# Patient Record
Sex: Female | Born: 1990
Health system: Southern US, Community
[De-identification: ages and names within clinical notes are randomized; demographics above are authoritative.]

## PROBLEM LIST (undated history)

## (undated) HISTORY — PX: SKIN BIOPSY: SHX1

---
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
## 2008-06-13 ENCOUNTER — Emergency Department: Payer: Self-pay | Admitting: Emergency Medicine

## 2008-10-05 ENCOUNTER — Emergency Department: Payer: Self-pay | Admitting: Emergency Medicine

## 2009-05-03 ENCOUNTER — Inpatient Hospital Stay: Payer: Self-pay | Admitting: Obstetrics and Gynecology

## 2015-05-20 ENCOUNTER — Encounter: Payer: Self-pay | Admitting: *Deleted

## 2015-05-20 ENCOUNTER — Emergency Department
Admission: EM | Admit: 2015-05-20 | Discharge: 2015-05-20 | Disposition: A | Discharge status: Home / Self Care | Payer: Self-pay | Attending: Emergency Medicine | Admitting: Emergency Medicine

## 2015-05-20 DIAGNOSIS — Y92410 Unspecified street and highway as the place of occurrence of the external cause: Secondary | ICD-10-CM | POA: Insufficient documentation

## 2015-05-20 DIAGNOSIS — Y9389 Activity, other specified: Secondary | ICD-10-CM | POA: Insufficient documentation

## 2015-05-20 DIAGNOSIS — Y998 Other external cause status: Secondary | ICD-10-CM | POA: Insufficient documentation

## 2015-05-20 DIAGNOSIS — S0101XA Laceration without foreign body of scalp, initial encounter: Secondary | ICD-10-CM | POA: Insufficient documentation

## 2015-05-20 MED ORDER — METHOCARBAMOL 500 MG PO TABS
1000.0000 mg | ORAL_TABLET | Freq: Once | ORAL | Status: AC
  Administered 2015-05-20: 1000 mg via ORAL
  Filled 2015-05-20: qty 2

## 2015-05-20 MED ORDER — LIDOCAINE HCL (PF) 1 % IJ SOLN
INTRAMUSCULAR | Status: AC
  Filled 2015-05-20: qty 5

## 2015-05-20 MED ORDER — METHOCARBAMOL 750 MG PO TABS: 1500.0000 mg | ORAL_TABLET | Freq: Four times a day (QID) | ORAL | Status: DC

## 2015-05-20 MED ORDER — TRAMADOL HCL 50 MG PO TABS
50.0000 mg | ORAL_TABLET | Freq: Once | ORAL | Status: AC
  Administered 2015-05-20: 50 mg via ORAL
  Filled 2015-05-20: qty 1

## 2015-05-20 MED ORDER — IBUPROFEN 800 MG PO TABS: 800.0000 mg | ORAL_TABLET | Freq: Three times a day (TID) | ORAL | Status: DC | PRN

## 2015-05-20 MED ORDER — IBUPROFEN 800 MG PO TABS
800.0000 mg | ORAL_TABLET | Freq: Once | ORAL | Status: AC
  Administered 2015-05-20: 800 mg via ORAL
  Filled 2015-05-20: qty 1

## 2015-05-20 MED ORDER — TRAMADOL HCL 50 MG PO TABS: 50.0000 mg | ORAL_TABLET | Freq: Four times a day (QID) | ORAL | Status: DC | PRN
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
## 2015-06-03 ENCOUNTER — Emergency Department
Admission: EM | Admit: 2015-06-03 | Discharge: 2015-06-03 | Disposition: A | Discharge status: Home / Self Care | Payer: Self-pay | Attending: Emergency Medicine | Admitting: Emergency Medicine

## 2015-06-03 DIAGNOSIS — Z4802 Encounter for removal of sutures: Secondary | ICD-10-CM | POA: Insufficient documentation

## 2015-06-03 DIAGNOSIS — Z79899 Other long term (current) drug therapy: Secondary | ICD-10-CM | POA: Insufficient documentation

## 2015-06-03 NOTE — ED Notes (Signed)
Pt here for suture removal to back of head.

## 2015-06-03 NOTE — Discharge Instructions (Signed)
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
## 2016-02-17 ENCOUNTER — Encounter: Payer: Self-pay | Admitting: Emergency Medicine

## 2016-02-17 DIAGNOSIS — Z5321 Procedure and treatment not carried out due to patient leaving prior to being seen by health care provider: Secondary | ICD-10-CM | POA: Diagnosis not present

## 2016-02-17 DIAGNOSIS — R109 Unspecified abdominal pain: Secondary | ICD-10-CM | POA: Insufficient documentation

## 2016-02-17 LAB — COMPREHENSIVE METABOLIC PANEL
ALBUMIN: 4.6 g/dL (ref 3.5–5.0)
ALT: 19 U/L (ref 14–54)
ANION GAP: 8 (ref 5–15)
AST: 19 U/L (ref 15–41)
Alkaline Phosphatase: 71 U/L (ref 38–126)
BUN: 13 mg/dL (ref 6–20)
CHLORIDE: 107 mmol/L (ref 101–111)
CO2: 23 mmol/L (ref 22–32)
Calcium: 9.3 mg/dL (ref 8.9–10.3)
Creatinine, Ser: 0.95 mg/dL (ref 0.44–1.00)
GFR calc Af Amer: >60 mL/min (ref >60)
GFR calc non Af Amer: >60 mL/min (ref >60)
GLUCOSE: 111 mg/dL — AB (ref 65–99)
POTASSIUM: 4.3 mmol/L (ref 3.5–5.1)
Sodium: 138 mmol/L (ref 135–145)
Total Bilirubin: 0.6 mg/dL (ref 0.3–1.2)
Total Protein: 7.7 g/dL (ref 6.5–8.1)

## 2016-02-17 LAB — CBC
HEMATOCRIT: 44.7 % (ref 35.0–47.0)
HEMOGLOBIN: 15.2 g/dL (ref 12.0–16.0)
MCH: 31 pg (ref 26.0–34.0)
MCHC: 34 g/dL (ref 32.0–36.0)
MCV: 91.3 fL (ref 80.0–100.0)
Platelets: 286 10*3/uL (ref 150–440)
RBC: 4.89 MIL/uL (ref 3.80–5.20)
RDW: 12.3 % (ref 11.5–14.5)
WBC: 10 10*3/uL (ref 3.6–11.0)

## 2016-02-17 LAB — URINALYSIS COMPLETE WITH MICROSCOPIC (ARMC ONLY)
BACTERIA UA: NONE SEEN
Bilirubin Urine: NEGATIVE
Glucose, UA: NEGATIVE mg/dL
HGB URINE DIPSTICK: NEGATIVE
LEUKOCYTES UA: NEGATIVE
NITRITE: NEGATIVE
PH: 5 (ref 5.0–8.0)
PROTEIN: 30 mg/dL — AB
SPECIFIC GRAVITY, URINE: 1.041 — AB (ref 1.005–1.030)

## 2016-02-17 LAB — POCT PREGNANCY, URINE: PREG TEST UR: NEGATIVE

## 2016-02-17 LAB — LIPASE, BLOOD: LIPASE: 31 U/L (ref 11–51)

## 2016-02-17 NOTE — ED Triage Notes (Signed)
Patient ambulatory to triage with steady gait, without difficulty or distress noted; pt reports having right lower abd pain since Saturday accomp by recent constipation; BM today but pain increased this evening ; denies N/V

## 2016-02-18 ENCOUNTER — Emergency Department
Admission: EM | Admit: 2016-02-18 | Discharge: 2016-02-18 | Disposition: A | Discharge status: Home / Self Care | Payer: No Typology Code available for payment source | Attending: Student in an Organized Health Care Education/Training Program | Admitting: Student in an Organized Health Care Education/Training Program

## 2017-09-27 ENCOUNTER — Encounter: Payer: Self-pay | Admitting: Emergency Medicine

## 2017-09-27 ENCOUNTER — Emergency Department
Admission: EM | Admit: 2017-09-27 | Discharge: 2017-09-27 | Discharge status: Against Medical Advice | Payer: Self-pay | Attending: Emergency Medicine | Admitting: Emergency Medicine

## 2017-09-27 DIAGNOSIS — Z5321 Procedure and treatment not carried out due to patient leaving prior to being seen by health care provider: Secondary | ICD-10-CM | POA: Insufficient documentation

## 2017-09-27 DIAGNOSIS — T7840XA Allergy, unspecified, initial encounter: Secondary | ICD-10-CM | POA: Insufficient documentation

## 2017-09-27 NOTE — ED Triage Notes (Signed)
Pt comes into the  ED via POV c/o possible allergic reaction.  Patient states she had feta cheese on Monday and today and had the same symptom both times.  States she feels like her throat is slightly inflamed but there is no pain, no difficulty breathing, and no difficulty swallowing.  Patient in NAD at this time with even and unlabored respirations. Patient took zyrtec prior to arriving and she states it has gotten better since then.

## 2017-09-27 NOTE — ED Notes (Signed)
Called in Pod D wait with no answer. 

## 2017-09-27 NOTE — ED Notes (Signed)
Patient called to be roomed x3, no response noted at this time from patient.  Staff checked restrooms, lobby, and outdoor facility.   

## 2017-09-27 NOTE — ED Notes (Signed)
Called in [place] wait with no answer.

## 2018-11-26 ENCOUNTER — Emergency Department: Payer: Commercial Managed Care - PPO

## 2018-11-26 ENCOUNTER — Encounter: Payer: Self-pay | Admitting: Emergency Medicine

## 2018-11-26 ENCOUNTER — Emergency Department
Admission: EM | Admit: 2018-11-26 | Discharge: 2018-11-26 | Disposition: A | Discharge status: Home / Self Care | Payer: Commercial Managed Care - PPO | Attending: Emergency Medicine | Admitting: Emergency Medicine

## 2018-11-26 ENCOUNTER — Other Ambulatory Visit: Payer: Self-pay

## 2018-11-26 DIAGNOSIS — R079 Chest pain, unspecified: Secondary | ICD-10-CM | POA: Insufficient documentation

## 2018-11-26 LAB — COMPREHENSIVE METABOLIC PANEL
ALT: 12 U/L (ref 0–44)
AST: 24 U/L (ref 15–41)
Albumin: 4.1 g/dL (ref 3.5–5.0)
Alkaline Phosphatase: 53 U/L (ref 38–126)
Anion gap: 5 (ref 5–15)
BUN: 13 mg/dL (ref 6–20)
CO2: 24 mmol/L (ref 22–32)
Calcium: 8.5 mg/dL — ABNORMAL LOW (ref 8.9–10.3)
Chloride: 110 mmol/L (ref 98–111)
Creatinine, Ser: 0.64 mg/dL (ref 0.44–1.00)
GFR calc Af Amer: >60 mL/min (ref >60)
GFR calc non Af Amer: >60 mL/min (ref >60)
Glucose, Bld: 94 mg/dL (ref 70–99)
Potassium: 4.4 mmol/L (ref 3.5–5.1)
Sodium: 139 mmol/L (ref 135–145)
Total Bilirubin: 0.9 mg/dL (ref 0.3–1.2)
Total Protein: 6.8 g/dL (ref 6.5–8.1)

## 2018-11-26 LAB — CBC WITH DIFFERENTIAL/PLATELET
Abs Immature Granulocytes: 0 10*3/uL (ref 0.00–0.07)
Basophils Absolute: 0.1 10*3/uL (ref 0.0–0.1)
Basophils Relative: 1 %
Eosinophils Absolute: 0.3 10*3/uL (ref 0.0–0.5)
Eosinophils Relative: 5 %
HCT: 38 % (ref 36.0–46.0)
Hemoglobin: 12.9 g/dL (ref 12.0–15.0)
Immature Granulocytes: 0 %
Lymphocytes Relative: 32 %
Lymphs Abs: 1.8 10*3/uL (ref 0.7–4.0)
MCH: 30.9 pg (ref 26.0–34.0)
MCHC: 33.9 g/dL (ref 30.0–36.0)
MCV: 90.9 fL (ref 80.0–100.0)
Monocytes Absolute: 0.3 10*3/uL (ref 0.1–1.0)
Monocytes Relative: 6 %
Neutro Abs: 3.2 10*3/uL (ref 1.7–7.7)
Neutrophils Relative %: 56 %
Platelets: 296 10*3/uL (ref 150–400)
RBC: 4.18 MIL/uL (ref 3.87–5.11)
RDW: 12 % (ref 11.5–15.5)
WBC: 5.7 10*3/uL (ref 4.0–10.5)
nRBC: 0 % (ref 0.0–0.2)

## 2018-11-26 LAB — POCT PREGNANCY, URINE: Preg Test, Ur: NEGATIVE

## 2018-11-26 LAB — TROPONIN I (HIGH SENSITIVITY): Troponin I (High Sensitivity): <2 ng/L (ref <18)

## 2018-11-26 MED ORDER — LIDOCAINE VISCOUS HCL 2 % MT SOLN
15.0000 mL | Freq: Once | OROMUCOSAL | Status: AC
  Administered 2018-11-26: 13:00:00 15 mL via ORAL
  Filled 2018-11-26: qty 15

## 2018-11-26 MED ORDER — ALUM & MAG HYDROXIDE-SIMETH 200-200-20 MG/5ML PO SUSP
30.0000 mL | Freq: Once | ORAL | Status: AC
  Administered 2018-11-26: 30 mL via ORAL
  Filled 2018-11-26: qty 30

## 2018-11-26 MED ORDER — DICYCLOMINE HCL 10 MG/5ML PO SOLN
10.0000 mg | Freq: Once | ORAL | Status: AC
  Administered 2018-11-26: 10 mg via ORAL
  Filled 2018-11-26 (×2): qty 5

## 2018-11-26 MED ORDER — FAMOTIDINE 20 MG PO TABS: 20.0000 mg | ORAL_TABLET | Freq: Two times a day (BID) | ORAL | 1 refills | Status: DC

## 2018-11-26 NOTE — ED Notes (Signed)
Patient is resting comfortably. 

## 2018-11-26 NOTE — ED Notes (Signed)
ED Provider Malinda  at bedside. 

## 2018-11-26 NOTE — ED Provider Notes (Signed)
[facility] Emergency Department Provider Note   ____________________________________________   First MD Initiated Contact with Patient 11/26/18 1208     (approximate)  I have reviewed the triage vital signs and the nursing notes.   HISTORY  Chief Complaint Chest Pain    HPI Shelby Hartman is a [age] y.o. female reports chest pressure starting last night while she was lying down watching TV.  She told the nurses died about 1030.  Is been going on constantly since that as long as she is been awake.  She did go to sleep at about 2:00 last night.  Pain is heavy it is not made worse by deep breathing.  She does feel little short of breath though.  She is a smoker but does not take any medications.  Exercise does not change it.  Drinking does not change it.  She has not eaten since the pain started.  Pain is moderate.         History reviewed. No pertinent past medical history.  There are no active problems to display for this patient.   Past Surgical History:  Procedure Laterality Date  . CESAREAN SECTION      Prior to Admission medications   Medication Sig Start Date End Date Taking? Authorizing Provider  ibuprofen (ADVIL,MOTRIN) 800 MG tablet Take 1 tablet (800 mg total) by mouth every 8 (eight) hours as needed. 05/20/15   Joni ReiningSmith, Ronald K, PA-C  methocarbamol (ROBAXIN-750) 750 MG tablet Take 2 tablets (1,500 mg total) by mouth 4 (four) times daily. 05/20/15   Joni ReiningSmith, Ronald K, PA-C  traMADol (ULTRAM) 50 MG tablet Take 1 tablet (50 mg total) by mouth every 6 (six) hours as needed for moderate pain. 05/20/15   Joni ReiningSmith, Ronald K, PA-C    Allergies Patient has no known allergies.  History reviewed. No pertinent family history.  Social History Social History   Tobacco Use  . Smoking status: Never Smoker  . Smokeless tobacco: Never Used  Substance Use Topics  . Alcohol use: No  . Drug use: Never    Review of Systems  Constitutional: No  fever/chills Eyes: No visual changes. ENT: No sore throat. Cardiovascular: chest pain. Respiratory:  shortness of breath. Gastrointestinal: No abdominal pain.  No nausea, no vomiting.  No diarrhea.  No constipation. Genitourinary: Negative for dysuria. Musculoskeletal: Negative for back pain. Skin: Negative for rash. Neurological: Negative for headaches, focal weakness  ____________________________________________   PHYSICAL EXAM:  VITAL SIGNS: ED Triage Vitals  Enc Vitals Group     BP --      Pulse Rate 11/26/18 1205 66     Resp 11/26/18 1205 15     Temp --      Temp src --      SpO2 --      Weight 11/26/18 1206 118 lb (53.5 kg)     Height 11/26/18 1206 5\' 1"  (1.549 m)     Head Circumference --      Peak Flow --      Pain Score 11/26/18 1206 8     Pain Loc --      Pain Edu? --      Excl. in [place]? --     Constitutional: Alert and oriented.  Patient is tearful but does not appear to be in any marked distress Eyes: Conjunctivae are normal. I. Head: Atraumatic. Nose: No congestion/rhinnorhea. Mouth/Throat: Mucous membranes are moist.  Oropharynx non-erythematous. Neck: No stridor.  Cardiovascular: Normal rate, regular rhythm. Grossly normal  heart sounds.  Good peripheral circulation. Respiratory: Normal respiratory effort.  No retractions. Lungs CTAB. Gastrointestinal: Soft and nontender. No distention. No abdominal bruits. . Musculoskeletal: No lower extremity tenderness nor edema.   Neurologic:  Normal speech and language. No gross focal neurologic deficits are appreciated. Skin:  Skin is warm, dry and intact. No rash noted. Psychiatric: Mood and affect are normal. Speech and behavior are normal.  ____________________________________________   LABS (all labs ordered are listed, but only abnormal results are displayed)  Labs Reviewed  COMPREHENSIVE METABOLIC PANEL - Abnormal; Notable for the following components:      Result Value   Calcium 8.5 (*)    All other  components within normal limits  TROPONIN I (HIGH SENSITIVITY)  CBC WITH DIFFERENTIAL/PLATELET  TROPONIN I (HIGH SENSITIVITY)  POC URINE PREG, ED  POCT PREGNANCY, URINE   ____________________________________________  EKG  EKG read and interpreted by me shows normal sinus rhythm rate of 66 normal axis no acute ST-T wave changes ____________________________________________  RADIOLOGY  ED MD interpretation:  Official radiology report(s): Dg Chest 2 View  Result Date: 11/26/2018 CLINICAL DATA:  28 year old female with a history of chest pain EXAM: CHEST - 2 VIEW COMPARISON:  12/03/2007 FINDINGS: Cardiomediastinal silhouette within normal limits in size and contour. No evidence of central vascular congestion or interlobular septal thickening. No pneumothorax or pleural effusion. No confluent airspace disease. Mild scoliotic curvature of the thoracic spine is similar to the comparison plain film. No displaced fracture IMPRESSION: Negative for acute cardiopulmonary disease Electronically Signed   By: Corrie Mckusick D.O.   On: 11/26/2018 12:46    ____________________________________________   PROCEDURES  Procedure(s) performed (including Critical Care):  Procedures   ____________________________________________   INITIAL IMPRESSION / ASSESSMENT AND PLAN / ED COURSE    Patient's pain resolved completely with a GI cocktail.  Troponin is negative EKG looks okay.  Most likely this pain was from esophageal spasm.  I will have her follow-up with her regular doctor return if she is worse.         ____________________________________________   FINAL CLINICAL IMPRESSION(S) / ED DIAGNOSES  Final diagnoses:  Chest pain, unspecified type     ED Discharge Orders    None       Note:  This document was prepared using Dragon voice recognition software and may include unintentional dictation errors.    Nena Polio, MD 11/26/18 [phone]

## 2018-11-26 NOTE — ED Notes (Signed)
ED Provider, Cinda Quest  at bedside to provide report.

## 2018-11-26 NOTE — Discharge Instructions (Addendum)
If the pain returns and is not relieved by Maalox or Tums please return here.  Please take the Pepcid 1 twice a day for the next week or so.  Please follow-up with your regular doctor.  I think that this chest discomfort was caused by a little reflux and/or esophageal spasm.  Your heart blood test and EKG look normal.

## 2018-11-26 NOTE — ED Notes (Signed)
Patient transported to Ultrasound 

## 2018-11-26 NOTE — ED Triage Notes (Signed)
Pt from home via [facility]. Per EMS, pt present w/mid CP since yesterday at 1030pm, mid CP feel "like pressure" and "my arms start tingling". Given by EMS PTA 324 ASA, 2 sub nitro. NAD noted upon arrival.

## 2018-11-26 NOTE — ED Notes (Signed)
Pt st intermittent CP 8/10 was sudden  when pt was lying down in bed, pt denies dizziness, N/V. Per pt "it get worse when I sit down" "it feels like something it's stuck in my troath". Pt denies fever, cough. NAD noted upon assessment.

## 2018-12-06 ENCOUNTER — Other Ambulatory Visit (HOSPITAL_COMMUNITY): Payer: Self-pay | Admitting: Gastroenterology

## 2018-12-06 ENCOUNTER — Other Ambulatory Visit: Payer: Self-pay | Admitting: Gastroenterology

## 2018-12-06 DIAGNOSIS — R1310 Dysphagia, unspecified: Secondary | ICD-10-CM

## 2018-12-06 DIAGNOSIS — R1319 Other dysphagia: Secondary | ICD-10-CM

## 2018-12-11 ENCOUNTER — Other Ambulatory Visit: Payer: Self-pay

## 2018-12-11 ENCOUNTER — Ambulatory Visit
Admission: RE | Admit: 2018-12-11 | Discharge: 2018-12-11 | Disposition: A | Discharge status: Home / Self Care | Payer: Commercial Managed Care - PPO | Source: Ambulatory Visit | Attending: Gastroenterology | Admitting: Gastroenterology

## 2018-12-11 DIAGNOSIS — R1310 Dysphagia, unspecified: Secondary | ICD-10-CM | POA: Insufficient documentation

## 2018-12-11 DIAGNOSIS — R1319 Other dysphagia: Secondary | ICD-10-CM

## 2018-12-28 ENCOUNTER — Other Ambulatory Visit: Admission: RE | Admit: 2018-12-28 | Payer: Commercial Managed Care - PPO | Source: Ambulatory Visit

## 2019-01-01 ENCOUNTER — Encounter: Admission: RE | Payer: Self-pay | Source: Home / Self Care

## 2019-01-01 ENCOUNTER — Ambulatory Visit
Admission: RE | Admit: 2019-01-01 | Payer: Commercial Managed Care - PPO | Source: Home / Self Care | Admitting: Internal Medicine

## 2019-01-01 SURGERY — ESOPHAGOGASTRODUODENOSCOPY (EGD) WITH PROPOFOL
Anesthesia: General

## 2019-01-18 ENCOUNTER — Encounter: Payer: Self-pay | Admitting: Certified Nurse Midwife

## 2019-01-18 ENCOUNTER — Telehealth: Payer: Self-pay

## 2019-01-18 ENCOUNTER — Other Ambulatory Visit: Payer: Self-pay

## 2019-01-18 ENCOUNTER — Telehealth: Payer: Self-pay | Admitting: Certified Nurse Midwife

## 2019-01-18 ENCOUNTER — Other Ambulatory Visit (HOSPITAL_COMMUNITY)
Admission: RE | Admit: 2019-01-18 | Discharge: 2019-01-18 | Disposition: A | Discharge status: Home / Self Care | Payer: Commercial Managed Care - PPO | Source: Ambulatory Visit | Attending: Certified Nurse Midwife | Admitting: Certified Nurse Midwife

## 2019-01-18 ENCOUNTER — Ambulatory Visit (INDEPENDENT_AMBULATORY_CARE_PROVIDER_SITE_OTHER): Payer: Commercial Managed Care - PPO | Admitting: Certified Nurse Midwife

## 2019-01-18 VITALS — BP 105/70 | HR 93 | Ht 61.0 in | Wt 113.4 lb

## 2019-01-18 DIAGNOSIS — Z124 Encounter for screening for malignant neoplasm of cervix: Secondary | ICD-10-CM | POA: Diagnosis not present

## 2019-01-18 DIAGNOSIS — Z30011 Encounter for initial prescription of contraceptive pills: Secondary | ICD-10-CM | POA: Diagnosis not present

## 2019-01-18 DIAGNOSIS — Z01419 Encounter for gynecological examination (general) (routine) without abnormal findings: Secondary | ICD-10-CM | POA: Diagnosis present

## 2019-01-18 DIAGNOSIS — Z13220 Encounter for screening for lipoid disorders: Secondary | ICD-10-CM | POA: Diagnosis not present

## 2019-01-18 DIAGNOSIS — Z136 Encounter for screening for cardiovascular disorders: Secondary | ICD-10-CM

## 2019-01-18 LAB — POCT URINE PREGNANCY: Preg Test, Ur: NEGATIVE

## 2019-01-18 MED ORDER — DROSPIRENONE-ETHINYL ESTRADIOL 3-0.02 MG PO TABS: 1.0000 | ORAL_TABLET | Freq: Every day | ORAL | 11 refills | Status: DC

## 2019-01-18 NOTE — Patient Instructions (Signed)
Preventive Care 21-30 Years Old, Female Preventive care refers to visits with your health care provider and lifestyle choices that can promote health and wellness. This includes:  A yearly physical exam. This may also be called an annual well check.  Regular dental visits and eye exams.  Immunizations.  Screening for certain conditions.  Healthy lifestyle choices, such as eating a healthy diet, getting regular exercise, not using drugs or products that contain nicotine and tobacco, and limiting alcohol use. What can I expect for my preventive care visit? Physical exam Your health care provider will check your:  Height and weight. This may be used to calculate body mass index (BMI), which tells if you are at a healthy weight.  Heart rate and blood pressure.  Skin for abnormal spots. Counseling Your health care provider may ask you questions about your:  Alcohol, tobacco, and drug use.  Emotional well-being.  Home and relationship well-being.  Sexual activity.  Eating habits.  Work and work environment.  Method of birth control.  Menstrual cycle.  Pregnancy history. What immunizations do I need?  Influenza (flu) vaccine  This is recommended every year. Tetanus, diphtheria, and pertussis (Tdap) vaccine  You may need a Td booster every 10 years. Varicella (chickenpox) vaccine  You may need this if you have not been vaccinated. Human papillomavirus (HPV) vaccine  If recommended by your health care provider, you may need three doses over 6 months. Measles, mumps, and rubella (MMR) vaccine  You may need at least one dose of MMR. You may also need a second dose. Meningococcal conjugate (MenACWY) vaccine  One dose is recommended if you are age 19-21 years and a first-year college student living in a residence hall, or if you have one of several medical conditions. You may also need additional booster doses. Pneumococcal conjugate (PCV13) vaccine  You may need  this if you have certain conditions and were not previously vaccinated. Pneumococcal polysaccharide (PPSV23) vaccine  You may need one or two doses if you smoke cigarettes or if you have certain conditions. Hepatitis A vaccine  You may need this if you have certain conditions or if you travel or work in places where you may be exposed to hepatitis A. Hepatitis B vaccine  You may need this if you have certain conditions or if you travel or work in places where you may be exposed to hepatitis B. Haemophilus influenzae type b (Hib) vaccine  You may need this if you have certain conditions. You may receive vaccines as individual doses or as more than one vaccine together in one shot (combination vaccines). Talk with your health care provider about the risks and benefits of combination vaccines. What tests do I need?  Blood tests  Lipid and cholesterol levels. These may be checked every 5 years starting at age 20.  Hepatitis C test.  Hepatitis B test. Screening  Diabetes screening. This is done by checking your blood sugar (glucose) after you have not eaten for a while (fasting).  Sexually transmitted disease (STD) testing.  BRCA-related cancer screening. This may be done if you have a family history of breast, ovarian, tubal, or peritoneal cancers.  Pelvic exam and Pap test. This may be done every 3 years starting at age 21. Starting at age 30, this may be done every 5 years if you have a Pap test in combination with an HPV test. Talk with your health care provider about your test results, treatment options, and if necessary, the need for more tests.   Follow these instructions at home: Eating and drinking   Eat a diet that includes fresh fruits and vegetables, whole grains, lean protein, and low-fat dairy.  Take vitamin and mineral supplements as recommended by your health care provider.  Do not drink alcohol if: ? Your health care provider tells you not to drink. ? You are  pregnant, may be pregnant, or are planning to become pregnant.  If you drink alcohol: ? Limit how much you have to 0-1 drink a day. ? Be aware of how much alcohol is in your drink. In the [place] one drink equals one 12 oz bottle of beer (355 mL), one 5 oz glass of wine (148 mL), or one 1 oz glass of hard liquor (44 mL). Lifestyle  Take daily care of your teeth and gums.  Stay active. Exercise for at least 30 minutes on 5 or more days each week.  Do not use any products that contain nicotine or tobacco, such as cigarettes, e-cigarettes, and chewing tobacco. If you need help quitting, ask your health care provider.  If you are sexually active, practice safe sex. Use a condom or other form of birth control (contraception) in order to prevent pregnancy and STIs (sexually transmitted infections). If you plan to become pregnant, see your health care provider for a preconception visit. What's next?  Visit your health care provider once a year for a well check visit.  Ask your health care provider how often you should have your eyes and teeth checked.  Stay up to date on all vaccines. This information is not intended to replace advice given to you by your health care provider. Make sure you discuss any questions you have with your health care provider. Document Released: 07/05/2001 Document Revised: 01/18/2018 Document Reviewed: 01/18/2018 Elsevier Patient Education  2020 Elsevier Inc.  

## 2019-01-18 NOTE — Progress Notes (Addendum)
GYNECOLOGY ANNUAL PREVENTATIVE CARE ENCOUNTER NOTE  History:     Shelby Hartman is a 28 y.o. No obstetric history on file. female here for a routine annual gynecologic exam.  Current complaints: none.   Denies abnormal vaginal bleeding, discharge, pelvic pain, problems with intercourse or other gynecologic concerns.     Is not married, has one female partner 2 children 24, 80 yr old Works from home.   Gynecologic History Patient's last menstrual period was 12/24/2018 (exact date). Contraception: condoms, had marina 2015-did not like it.  Last Pap: 42yr ago. Results were: normal per pt  Last mammogram: n/a .  Obstetric History OB History  Gravida Para Term Preterm AB Living  '2 2 2     2  ' SAB TAB Ectopic Multiple Live Births          2    # Outcome Date GA Lbr Len/2nd Weight Sex Delivery Anes PTL Lv  2 Term 05/03/09   6 lb 7 oz (2.92 kg) M CS-Unspec  N LIV  1 Term 08/23/05   5 lb 13 oz (2.637 kg) F CS-Unspec  N LIV    No past medical history on file.  Has family history of breast caner, information given on BRCA testing given.   Past Surgical History:  Procedure Laterality Date  . CESAREAN SECTION      Current Outpatient Medications on File Prior to Visit  Medication Sig Dispense Refill  . famotidine (PEPCID) 20 MG tablet Take 1 tablet (20 mg total) by mouth 2 (two) times daily. 60 tablet 1  . ibuprofen (ADVIL,MOTRIN) 800 MG tablet Take 1 tablet (800 mg total) by mouth every 8 (eight) hours as needed. 30 tablet 0   No current facility-administered medications on file prior to visit.     No Known Allergies  Social History:  reports that she has never smoked. She has never used smokeless tobacco. She reports that she does not drink alcohol or use drugs.  Denies smoking/vaping, drugs, occasional alcohol.  Exercise- used to , has not been able to since COVID and virtual schooling.   Family History  Problem Relation Age of Onset  . Breast cancer Maternal Aunt   .  Breast cancer Paternal Aunt     The following portions of the patient's history were reviewed and updated as appropriate: allergies, current medications, past family history, past medical history, past social history, past surgical history and problem list.  Review of Systems Pertinent items noted in HPI and remainder of comprehensive ROS otherwise negative.  Physical Exam:  BP 105/70   Pulse 93   Ht '5\' 1"'  (1.549 m)   Wt 113 lb 6 oz (51.4 kg)   LMP 12/24/2018 (Exact Date)   BMI 21.42 kg/m  CONSTITUTIONAL: Well-developed, well-nourished female in no acute distress.  HENT:  Normocephalic, atraumatic, External right and left ear normal. Oropharynx is clear and moist EYES: Conjunctivae and EOM are normal. Pupils are equal, round, and reactive to light. No scleral icterus.  NECK: Normal range of motion, supple, no masses.  Normal thyroid.  SKIN: Skin is warm and dry. No rash noted. Not diaphoretic. No erythema. No pallor. MUSCULOSKELETAL: Normal range of motion. No tenderness.  No cyanosis, clubbing, or edema.  2+ distal pulses. NEUROLOGIC: Alert and oriented to person, place, and time. Normal reflexes, muscle tone coordination. No cranial nerve deficit noted. PSYCHIATRIC: Normal mood and affect. Normal behavior. Normal judgment and thought content. CARDIOVASCULAR: Normal heart rate noted, regular rhythm RESPIRATORY: Clear to auscultation  bilaterally. Effort and breath sounds normal, no problems with respiration noted. BREASTS: Symmetric in size. No masses, skin changes, nipple drainage, or lymphadenopathy. ABDOMEN: Soft, normal bowel sounds, no distention noted.  No tenderness, rebound or guarding.  PELVIC: Normal appearing external genitalia; normal appearing vaginal mucosa and cervix.  No abnormal discharge noted.  Pap smear obtained. Contact bleeding present.   Normal uterine size, no other palpable masses, no uterine or adnexal tenderness.   Assessment and Plan:    Annual Well Women  GYN Exam  Will follow up results of pap smear and manage accordingly. Mammogram not indicated Labs: Lipid panel, not fasting.  Orders: Yaz, she denies any contraindications to use. Has used pill in the past as teenager.  Constipation: encouraged increase water, fiber, exercise , and stool softner Routine preventative health maintenance measures emphasized. Please refer to After Visit Summary for other counseling recommendations.      Philip Aspen, CNM

## 2019-01-18 NOTE — Telephone Encounter (Signed)
Pt stated current pharmacy is out of birth control she would like her prescription to be switched to [facility] on [address] in [place]. Please advise.

## 2019-01-18 NOTE — Telephone Encounter (Signed)
Patient came back into the office stating her pharmacy called her and told her the [facility] script that was escribed is out of stock. Prescription was printed off and given to patient to take to another pharmacy.

## 2019-01-19 LAB — LIPID PANEL
Chol/HDL Ratio: 2.3 ratio (ref 0.0–4.4)
Cholesterol, Total: 171 mg/dL (ref 100–199)
HDL: 73 mg/dL (ref >39)
LDL Calculated: 89 mg/dL (ref 0–99)
Triglycerides: 47 mg/dL (ref 0–149)
VLDL Cholesterol Cal: 9 mg/dL (ref 5–40)

## 2019-01-22 LAB — CYTOLOGY - PAP
Diagnosis: NEGATIVE
HPV: NOT DETECTED

## 2019-04-20 ENCOUNTER — Ambulatory Visit
Admission: EM | Admit: 2019-04-20 | Discharge: 2019-04-20 | Disposition: A | Discharge status: Home / Self Care | Payer: Commercial Managed Care - PPO | Attending: Emergency Medicine | Admitting: Emergency Medicine

## 2019-04-20 ENCOUNTER — Encounter: Payer: Self-pay | Admitting: Emergency Medicine

## 2019-04-20 ENCOUNTER — Other Ambulatory Visit: Payer: Self-pay

## 2019-04-20 DIAGNOSIS — Z7189 Other specified counseling: Secondary | ICD-10-CM

## 2019-04-20 DIAGNOSIS — J029 Acute pharyngitis, unspecified: Secondary | ICD-10-CM

## 2019-04-20 DIAGNOSIS — R519 Headache, unspecified: Secondary | ICD-10-CM | POA: Diagnosis not present

## 2019-04-20 DIAGNOSIS — R5383 Other fatigue: Secondary | ICD-10-CM | POA: Diagnosis not present

## 2019-04-20 DIAGNOSIS — J069 Acute upper respiratory infection, unspecified: Secondary | ICD-10-CM

## 2019-04-20 LAB — RAPID STREP SCREEN (MED CTR MEBANE ONLY): Streptococcus, Group A Screen (Direct): NEGATIVE

## 2019-04-20 NOTE — Discharge Instructions (Signed)
Over the counter medication as discussed. Rest. Drink plenty of fluids. Monitor.  Remain home unless seeking further care.  Follow up with your primary care physician this week as needed. Return to Urgent care for new or worsening concerns.

## 2019-04-20 NOTE — ED Provider Notes (Signed)
[facility] ____________________________________________  Time seen: Approximately 12:34 PM  I have reviewed the triage vital signs and the nursing notes.   HISTORY  Chief Complaint Sore Throat, Fatigue, and Headache   HPI Shelby Hartman is a 28 y.o. female presented for evaluation of sore throat, occasional congestion, intermittent headache and fatigue present since Sunday into Monday.  States today her taste and smell became greatly diminished prompting her to come in.  Reports temperatures have not exceeded greater than 99.5.  States sore throat is mild.  Overall has continued to eat and drink well.  Denies known direct sick contacts.  Denies chest pain, shortness of breath, vomiting, diarrhea, abdominal pain or rash.  Has been taken over-the-counter vitamins.  Denies other aggravating alleviating factors.  Reports otherwise doing well.   History reviewed. No pertinent past medical history.  There are no active problems to display for this patient.   Past Surgical History:  Procedure Laterality Date  . CESAREAN SECTION       No current facility-administered medications for this encounter.   Current Outpatient Medications:  .  famotidine (PEPCID) 20 MG tablet, Take 1 tablet (20 mg total) by mouth 2 (two) times daily., Disp: 60 tablet, Rfl: 1 .  ibuprofen (ADVIL,MOTRIN) 800 MG tablet, Take 1 tablet (800 mg total) by mouth every 8 (eight) hours as needed., Disp: 30 tablet, Rfl: 0  Allergies Patient has no known allergies.  Family History  Problem Relation Age of Onset  . Breast cancer Maternal Aunt   . Breast cancer Paternal Aunt     Social History Social History   Tobacco Use  . Smoking status: Never Smoker  . Smokeless tobacco: Never Used  Substance Use Topics  . Alcohol use: No  . Drug use: Never    Review of Systems Constitutional: Positive low-grade fevers. ENT: Positive sore throat and congestion. Cardiovascular: Denies chest pain.  Respiratory: Denies shortness of breath. Gastrointestinal: No abdominal pain.  No nausea, no vomiting.  No diarrhea.   Genitourinary: Negative for dysuria. Musculoskeletal: Negative for back pain. Skin: Negative for rash.  ____________________________________________   PHYSICAL EXAM:  VITAL SIGNS: ED Triage Vitals  Enc Vitals Group     BP 04/20/19 1056 130/80     Pulse Rate 04/20/19 1056 88     Resp 04/20/19 1056 14     Temp 04/20/19 1056 99 F (37.2 C)     Temp Source 04/20/19 1056 Oral     SpO2 04/20/19 1056 100 %     Weight 04/20/19 1051 119 lb (54 kg)     Height 04/20/19 1051 5\' 1"  (1.549 m)     Head Circumference --      Peak Flow --      Pain Score 04/20/19 1051 7     Pain Loc --      Pain Edu? --      Excl. in [place]? --     Constitutional: Alert and oriented. Well appearing and in no acute distress. Eyes: Conjunctivae are normal. Head: Atraumatic. No sinus tenderness to palpation. No swelling. No erythema.  Ears: no erythema, normal TMs bilaterally.   Nose: Mild nasal congestion  Mouth/Throat: Mucous membranes are moist. Mild pharyngeal erythema. No tonsillar swelling or exudate.  Neck: No stridor.  No cervical spine tenderness to palpation. Hematological/Lymphatic/Immunilogical: No cervical lymphadenopathy. Cardiovascular: Normal rate, regular rhythm. Grossly normal heart sounds.  Good peripheral circulation. Respiratory: Normal respiratory effort.  No retractions. No wheezes, rales or rhonchi. Good air movement.  Musculoskeletal: Ambulatory with steady gait.  Neurologic:  Normal speech and language. No gait instability. Skin:  Skin appears warm, dry and intact. No rash noted. Psychiatric: Mood and affect are normal. Speech and behavior are normal.  ___________________________________________   LABS (all labs ordered are listed, but only abnormal results are displayed)  Labs Reviewed  RAPID STREP SCREEN ([facility] ONLY)  NOVEL CORONAVIRUS, NAA (HOSP  ORDER, SEND-OUT TO REF LAB; TAT 18-24 HRS)  CULTURE, GROUP A STREP [facility])    PROCEDURES Procedures     INITIAL IMPRESSION / ASSESSMENT AND PLAN / ED COURSE  Pertinent labs & imaging results that were available during my care of the patient were reviewed by me and considered in my medical decision making (see chart for details).  Well-appearing patient.  No acute distress.  Suspect viral illness.  Strep negative, will culture.  COVID-19 testing completed and advice given.  Suspect COVID-19.  Encourage rest, fluids, supportive care.  Education advice given.  Remain home unless seeking further care.  Discussed follow up with Primary care physician this week as needed. Discussed follow up and return parameters including no resolution or any worsening concerns. Patient verbalized understanding and agreed to plan.   ____________________________________________   FINAL CLINICAL IMPRESSION(S) / ED DIAGNOSES  Final diagnoses:  Upper respiratory tract infection, unspecified type  Advice given about COVID-19 virus infection     ED Discharge Orders    None       Note: This dictation was prepared with Dragon dictation along with smaller phrase technology. Any transcriptional errors that result from this process are unintentional.         Renford Dills, NP 04/20/19 1238

## 2019-04-20 NOTE — ED Triage Notes (Signed)
Patient c/o sore throat, HAs, and fatigue that started on Monday.  Patient reports low grade fevers.

## 2019-04-21 LAB — NOVEL CORONAVIRUS, NAA (HOSP ORDER, SEND-OUT TO REF LAB; TAT 18-24 HRS): SARS-CoV-2, NAA: NOT DETECTED

## 2019-04-23 LAB — CULTURE, GROUP A STREP (THRC)

## 2019-05-01 ENCOUNTER — Ambulatory Visit
Admission: EM | Admit: 2019-05-01 | Discharge: 2019-05-01 | Disposition: A | Discharge status: Home / Self Care | Payer: Commercial Managed Care - PPO | Attending: Family Medicine | Admitting: Family Medicine

## 2019-05-01 ENCOUNTER — Other Ambulatory Visit: Payer: Self-pay

## 2019-05-01 ENCOUNTER — Encounter: Payer: Self-pay | Admitting: Emergency Medicine

## 2019-05-01 DIAGNOSIS — R1310 Dysphagia, unspecified: Secondary | ICD-10-CM

## 2019-05-01 DIAGNOSIS — R070 Pain in throat: Secondary | ICD-10-CM

## 2019-05-01 MED ORDER — PREDNISONE 10 MG PO TABS: ORAL_TABLET | ORAL | 0 refills | Status: DC

## 2019-05-01 NOTE — ED Triage Notes (Signed)
Patient states she was here on 11/28 for what she describes as her throat swelling. She tested negative for COVID and negative for strep. She states she is here today because she feels like the sensation is getting worse.

## 2019-05-01 NOTE — ED Provider Notes (Signed)
[facility] ____________________________________________  Time seen: Approximately 12:49 PM  I have reviewed the triage vital signs and the nursing notes.   HISTORY  Chief Complaint Oral Swelling  HPI Shelby Hartman is a 28 y.o. female presenting for evaluation of throat discomfort.  Patient denies sore throat or pain.  Denies any current cough, congestion, fevers, rash, allergic reaction, vomiting, belching or acid reflux symptoms.  Patient describes sensation as difficulty swallowing solid foods as a feeling they get stuck as well as a tightness sensation.  Denies any difficulty swallowing liquids.  Appetite has continued normally, denies any weight loss.  States this has been present for the last 2 weeks since recent sickness.  Also reports this was present 6 months ago in which she saw GI.  Patient reports she had previously stopped taking her Pepcid but resumed in the last 2 weeks.  Also reports she has been having occasional allergic reactions to foods, which she states she eats something and then has itchy rash that goes away with Benadryl.  States last time this occurred was approximately 2 weeks ago.  Denies any current allergic reaction symptoms or itching.  States no acute worsening of swelling tightness sensation.  Reports otherwise doing well.  Of note, patient was seen in urgent care on 04/20/2019 for sore throat congestion headache and fatigue.  Patient at that time had a negative Covid test as well as negative strep culture.  Also in reviewing patient's chart patient has a history of esophageal dysphagia which she saw GI for in July of this year.  It was recommended to have a barium swallow as well as endoscopy.  Barium swallow was completed which was negative but endoscopy was never completed.  Patient had initially presented with more chest discomfort with throat discomfort at that time to the ER and was started on Pepcid and referred to GI with subsequent above.   Patient reported that the Pepcid was previously working well for her.  Patient's last menstrual period was 04/20/2019.    History reviewed. No pertinent past medical history.  There are no active problems to display for this patient.   Past Surgical History:  Procedure Laterality Date  . CESAREAN SECTION       No current facility-administered medications for this encounter.   Current Outpatient Medications:  .  famotidine (PEPCID) 20 MG tablet, Take 1 tablet (20 mg total) by mouth 2 (two) times daily., Disp: 60 tablet, Rfl: 1 .  predniSONE (DELTASONE) 10 MG tablet, Start 60 mg po day one, then 50 mg po day two, taper by 10 mg daily until complete., Disp: 21 tablet, Rfl: 0  Allergies Patient has no known allergies.  Family History  Problem Relation Age of Onset  . Breast cancer Maternal Aunt   . Breast cancer Paternal Aunt     Social History Social History   Tobacco Use  . Smoking status: Never Smoker  . Smokeless tobacco: Never Used  Substance Use Topics  . Alcohol use: No  . Drug use: Never    Review of Systems Constitutional: No fever ENT: No sore throat. As above.  Cardiovascular: Denies chest pain. Respiratory: Denies shortness of breath. Gastrointestinal: No abdominal pain.  No nausea, no vomiting.  No diarrhea.  Musculoskeletal: Negative for back pain. Skin: Negative for rash. Neurological: Negative for focal weakness or numbness.  ____________________________________________   PHYSICAL EXAM:  VITAL SIGNS: ED Triage Vitals  Enc Vitals Group     BP 05/01/19 1245 108/79  Pulse Rate 05/01/19 1245 76     Resp 05/01/19 1245 18     Temp 05/01/19 1245 98.7 F (37.1 C)     Temp Source 05/01/19 1245 Oral     SpO2 05/01/19 1245 99 %     Weight 05/01/19 1243 115 lb (52.2 kg)     Height 05/01/19 1243 5\' 1"  (1.549 m)     Head Circumference --      Peak Flow --      Pain Score 05/01/19 1242 0     Pain Loc --      Pain Edu? --      Excl. in [place]? --      Constitutional: Alert and oriented. Well appearing and in no acute distress. Eyes: Conjunctivae are normal.  ENT      Head: Normocephalic and atraumatic.      Nose: No congestion/rhinnorhea.      Mouth/Throat: Mucous membranes are moist.Oropharynx non-erythematous.  No tonsillar swelling exudate.  No lip, tongue or oral pharyngeal edema noted.  Clearing secretions well. Neck: No stridor. Supple without meningismus.  No mass. Hematological/Lymphatic/Immunilogical: No cervical lymphadenopathy.  Cardiovascular: Normal rate, regular rhythm. Grossly normal heart sounds.  Good peripheral circulation. Respiratory: Normal respiratory effort without tachypnea nor retractions. Breath sounds are clear and equal bilaterally. No wheezes, rales, rhonchi. Gastrointestinal: Soft and nontender. No CVA tenderness. Musculoskeletal:  Steady gait.  Neurologic:  Normal speech and language. Speech is normal. No gait instability.  Skin:  Skin is warm, dry and intact. No rash noted. Psychiatric: Mood and affect are normal. Speech and behavior are normal. Patient exhibits appropriate insight and judgment   ___________________________________________   LABS (all labs ordered are listed, but only abnormal results are displayed)  Labs Reviewed - No data to display ____________________________________________  PROCEDURES Procedures    INITIAL IMPRESSION / ASSESSMENT AND PLAN / ED COURSE  Pertinent labs & imaging results that were available during my care of the patient were reviewed by me and considered in my medical decision making (see chart for details).  Very well-appearing.  Does appear somewhat anxious.  Clearing secretions well.  Describes difficulty swallowing solid foods, previously seen GI for similar for esophageal dysphagia.  Also reports intermittent allergic reactions to what she describes as food with reaction of itchy rashes sometimes swelling.  Encourage allergy testing for this.  Recommend  continue Pepcid and will empirically treat with prednisone taper.  Strongly encouraged to call today to schedule follow-up with her gastroenterologist.  Patient does not currently have PCP, encouraged to establish local.  Proceed directly to emergency room for worsening complaints or difficulty swallowing or breathing. Discussed indication, risks and benefits of medications with patient.    Discussed follow up and return parameters including no resolution or any worsening concerns. Patient verbalized understanding and agreed to plan.   ____________________________________________   FINAL CLINICAL IMPRESSION(S) / ED DIAGNOSES  Final diagnoses:  Difficulty swallowing solids  Throat discomfort     ED Discharge Orders         Ordered    predniSONE (DELTASONE) 10 MG tablet     05/01/19 1320           Note: This dictation was prepared with Dragon dictation along with smaller phrase technology. Any transcriptional errors that result from this process are unintentional.         Marylene Land, NP 05/01/19 1401

## 2019-05-01 NOTE — Discharge Instructions (Addendum)
Take medication as prescribed.  Drink plenty fluids.  Small meals.  Chew foods well.  Continue daily Pepcid.  Follow-up with gastroenterology as soon as possible.  Also recommend allergy testing.   Return to Urgent care as needed. Proceed directly to ER increase difficulty swallowing, pain or worsening concerns.

## 2019-05-03 ENCOUNTER — Other Ambulatory Visit: Payer: Self-pay

## 2019-05-03 DIAGNOSIS — Z5321 Procedure and treatment not carried out due to patient leaving prior to being seen by health care provider: Secondary | ICD-10-CM | POA: Insufficient documentation

## 2019-05-03 DIAGNOSIS — R109 Unspecified abdominal pain: Secondary | ICD-10-CM | POA: Insufficient documentation

## 2019-05-03 MED ORDER — SODIUM CHLORIDE 0.9% FLUSH: 3.0000 mL | Freq: Once | INTRAVENOUS | Status: DC

## 2019-05-03 NOTE — ED Triage Notes (Signed)
Pt to the er for abd pain x 2 weeks. Pt has been using pepcid. Pt has been taking prednisone x 2 days. Pt has been seen at urgent care in [facility] on 2 occasions. Pt says she feels like her stomach is on fire. Negative covid test on Nov 28th. Pt tearful in triage.

## 2019-05-04 ENCOUNTER — Emergency Department
Admission: EM | Admit: 2019-05-04 | Discharge: 2019-05-04 | Disposition: A | Discharge status: Home / Self Care | Payer: Commercial Managed Care - PPO | Attending: Emergency Medicine | Admitting: Emergency Medicine

## 2019-05-04 LAB — CBC
HCT: 39.6 % (ref 36.0–46.0)
Hemoglobin: 13.3 g/dL (ref 12.0–15.0)
MCH: 30.5 pg (ref 26.0–34.0)
MCHC: 33.6 g/dL (ref 30.0–36.0)
MCV: 90.8 fL (ref 80.0–100.0)
Platelets: 345 10*3/uL (ref 150–400)
RBC: 4.36 MIL/uL (ref 3.87–5.11)
RDW: 11.7 % (ref 11.5–15.5)
WBC: 9.4 10*3/uL (ref 4.0–10.5)
nRBC: 0 % (ref 0.0–0.2)

## 2019-05-04 LAB — COMPREHENSIVE METABOLIC PANEL
ALT: 10 U/L (ref 0–44)
AST: 14 U/L — ABNORMAL LOW (ref 15–41)
Albumin: 4.8 g/dL (ref 3.5–5.0)
Alkaline Phosphatase: 52 U/L (ref 38–126)
Anion gap: 9 (ref 5–15)
BUN: 10 mg/dL (ref 6–20)
CO2: 24 mmol/L (ref 22–32)
Calcium: 9.3 mg/dL (ref 8.9–10.3)
Chloride: 106 mmol/L (ref 98–111)
Creatinine, Ser: 0.69 mg/dL (ref 0.44–1.00)
GFR calc Af Amer: >60 mL/min (ref >60)
GFR calc non Af Amer: >60 mL/min (ref >60)
Glucose, Bld: 116 mg/dL — ABNORMAL HIGH (ref 70–99)
Potassium: 3.8 mmol/L (ref 3.5–5.1)
Sodium: 139 mmol/L (ref 135–145)
Total Bilirubin: 0.6 mg/dL (ref 0.3–1.2)
Total Protein: 7.6 g/dL (ref 6.5–8.1)

## 2019-05-04 LAB — URINALYSIS, COMPLETE (UACMP) WITH MICROSCOPIC
Bilirubin Urine: NEGATIVE
Glucose, UA: NEGATIVE mg/dL
Hgb urine dipstick: NEGATIVE
Ketones, ur: NEGATIVE mg/dL
Leukocytes,Ua: NEGATIVE
Nitrite: NEGATIVE
Protein, ur: NEGATIVE mg/dL
Specific Gravity, Urine: 1.016 (ref 1.005–1.030)
pH: 6 (ref 5.0–8.0)

## 2019-05-04 LAB — POCT PREGNANCY, URINE: Preg Test, Ur: NEGATIVE

## 2019-05-04 LAB — LIPASE, BLOOD: Lipase: 27 U/L (ref 11–51)

## 2019-05-24 ENCOUNTER — Ambulatory Visit
Admission: EM | Admit: 2019-05-24 | Discharge: 2019-05-24 | Disposition: A | Discharge status: Home / Self Care | Payer: Commercial Managed Care - PPO | Attending: Family Medicine | Admitting: Family Medicine

## 2019-05-24 ENCOUNTER — Other Ambulatory Visit: Payer: Self-pay

## 2019-05-24 DIAGNOSIS — B370 Candidal stomatitis: Secondary | ICD-10-CM | POA: Insufficient documentation

## 2019-05-24 DIAGNOSIS — R070 Pain in throat: Secondary | ICD-10-CM

## 2019-05-24 LAB — GROUP A STREP BY PCR: Group A Strep by PCR: NOT DETECTED

## 2019-05-24 MED ORDER — FLUCONAZOLE 200 MG PO TABS: 200.0000 mg | ORAL_TABLET | Freq: Every day | ORAL | 0 refills | Status: AC

## 2019-05-24 MED ORDER — FLUCONAZOLE 200 MG PO TABS: 200.0000 mg | ORAL_TABLET | Freq: Every day | ORAL | 0 refills | Status: DC

## 2019-05-24 NOTE — Discharge Instructions (Addendum)
Diflucan as prescribed.  Awaiting Strep PCR test.  Take care  Dr. Adriana Simas

## 2019-05-24 NOTE — ED Provider Notes (Signed)
[facility]    CSN: [id number] Arrival date & time: 05/24/19  0836      History   Chief Complaint Chief Complaint  Patient presents with  . throat irritation   HPI   29 year old female presents with the above complaint.  Patient has had ongoing issues regarding her throat. She reportedly has had allergic reactions that have caused her to feel like her throat was tightening up and swelling. She has seen an allergist, her primary care physician, and a gastroenterologist. She has had allergy testing as well as endoscopy. Patient reports that she was recently seen by her primary care provider on 12/29. She was placed empirically on Augmentin for sore throat. She states that no strep test was done. Patient reports that she now feels like the back of her throat is red and she states that there are some white areas that are concerning for her. She states that it's dry and irritated and feels like sandpaper. She denies true pain. No fever. No nausea, vomiting, diarrhea. No reports of shortness of breath or difficulty swallowing at this time. No relieving factors. No other associated symptoms. No other complaints.  PMH, Surgical Hx, Family Hx, Social History reviewed and updated as below.  PMH: Anxiety, allergy  Past Surgical History:  Procedure Laterality Date  . CESAREAN SECTION     OB History    Gravida  2   Para  2   Term  2   Preterm      AB      Living  2     SAB      TAB      Ectopic      Multiple      Live Births  2          Home Medications    Prior to Admission medications   Medication Sig Start Date End Date Taking? Authorizing Provider  amoxicillin-clavulanate (AUGMENTIN) 875-125 MG tablet Take 1 tablet by mouth 2 (two) times daily.   Yes [provider]  pantoprazole (PROTONIX) 40 MG tablet Take 40 mg by mouth daily.   Yes [provider]  fluconazole (DIFLUCAN) 200 MG tablet Take 1 tablet (200 mg total) by mouth daily  for 7 days. 05/24/19 05/31/19  Coral Spikes, DO  drospirenone-ethinyl estradiol (YAZ) 3-0.02 MG tablet Take 1 tablet by mouth daily. 01/18/19 04/20/19  Philip Aspen, CNM  famotidine (PEPCID) 20 MG tablet Take 1 tablet (20 mg total) by mouth 2 (two) times daily. 11/26/18 05/24/19  Nena Polio, MD   Family History Family History  Problem Relation Age of Onset  . Breast cancer Maternal Aunt   . Breast cancer Paternal Aunt    Social History Social History   Tobacco Use  . Smoking status: Never Smoker  . Smokeless tobacco: Never Used  Substance Use Topics  . Alcohol use: No  . Drug use: Never   Allergies   Patient has no known allergies.   Review of Systems Review of Systems  Constitutional: Negative for fever.  HENT:       Throat irritation, redness, white patches.   Physical Exam Triage Vital Signs ED Triage Vitals  Enc Vitals Group     BP 05/24/19 0856 97/75     Pulse Rate 05/24/19 0856 78     Resp --      Temp 05/24/19 0856 98.4 F (36.9 C)     Temp Source 05/24/19 0856 Oral     SpO2 05/24/19 0856  98 %     Weight 05/24/19 0853 116 lb (52.6 kg)     Height 05/24/19 0853 5\' 1"  (1.549 m)     Head Circumference --      Peak Flow --      Pain Score 05/24/19 0853 0     Pain Loc --      Pain Edu? --      Excl. in [place]? --    Updated Vital Signs BP 97/75 (BP Location: Left Arm)   Pulse 78   Temp 98.4 F (36.9 C) (Oral)   Ht 5\' 1"  (1.549 m)   Wt 52.6 kg   LMP 05/23/2019   SpO2 98%   BMI 21.92 kg/m   Visual Acuity Right Eye Distance:   Left Eye Distance:   Bilateral Distance:    Right Eye Near:   Left Eye Near:    Bilateral Near:     Physical Exam Vitals and nursing note reviewed.  Constitutional:      General: She is not in acute distress.    Appearance: Normal appearance. She is not ill-appearing.  HENT:     Head: Normocephalic and atraumatic.     Mouth/Throat:     Comments: Oropharynx with mild to moderate erythema. White exudates noted in the soft  palate. Eyes:     General:        Right eye: No discharge.        Left eye: No discharge.     Conjunctiva/sclera: Conjunctivae normal.  Cardiovascular:     Rate and Rhythm: Normal rate and regular rhythm.     Heart sounds: No murmur.  Pulmonary:     Effort: Pulmonary effort is normal.     Breath sounds: Normal breath sounds. No wheezing, rhonchi or rales.  Neurological:     Mental Status: She is alert.  Psychiatric:        Mood and Affect: Mood normal.        Behavior: Behavior normal.    UC Treatments / Results  Labs (all labs ordered are listed, but only abnormal results are displayed) Labs Reviewed  GROUP A STREP BY PCR    EKG   Radiology No results found.  Procedures Procedures (including critical care time)  Medications Ordered in UC Medications - No data to display  Initial Impression / Assessment and Plan / UC Course  I have reviewed the triage vital signs and the nursing notes.  Pertinent labs & imaging results that were available during my care of the patient were reviewed by me and considered in my medical decision making (see chart for details).    29 year old female presents with suspected thrush. Strep PCR negative. Stopping Augmentin. Placing on Diflucan. Supportive care.  Final Clinical Impressions(s) / UC Diagnoses   Final diagnoses:  Thrush     Discharge Instructions     Diflucan as prescribed.  Awaiting Strep PCR test.  Take care  Dr. 05/25/2019    ED Prescriptions    Medication Sig Dispense Auth. Provider   fluconazole (DIFLUCAN) 200 MG tablet Take 1 tablet (200 mg total) by mouth daily for 7 days. 7 tablet 26, DO     PDMP not reviewed this encounter.   Adriana Simas, Tommie Sams 05/24/19 1125

## 2019-05-24 NOTE — ED Triage Notes (Signed)
Pt presents with c/o recent allergic reaction that caused her throat to swell. She has had an endoscopy and allergy testing to foods. She does not have those results yet. She now reports her throat is irritated and red with some white areas. She denies sore throat or swollen tonsils, fever or n/v/d. She denies any difficulty swallowing or breathing. She was started on amoxicillin and pantoprazole last Tuesday by her GI provider.

## 2019-06-06 ENCOUNTER — Other Ambulatory Visit: Payer: Self-pay | Admitting: Gerontology

## 2019-06-06 DIAGNOSIS — J029 Acute pharyngitis, unspecified: Secondary | ICD-10-CM

## 2019-06-18 ENCOUNTER — Other Ambulatory Visit: Payer: Self-pay

## 2019-06-18 ENCOUNTER — Ambulatory Visit
Admission: RE | Admit: 2019-06-18 | Discharge: 2019-06-18 | Disposition: A | Discharge status: Home / Self Care | Payer: Commercial Managed Care - PPO | Source: Ambulatory Visit | Attending: Gerontology | Admitting: Gerontology

## 2019-06-18 DIAGNOSIS — J029 Acute pharyngitis, unspecified: Secondary | ICD-10-CM | POA: Insufficient documentation

## 2019-06-18 MED ORDER — IOHEXOL 300 MG/ML  SOLN
75.0000 mL | Freq: Once | INTRAMUSCULAR | Status: AC | PRN
  Administered 2019-06-18: 15:00:00 75 mL via INTRAVENOUS

## 2020-08-31 IMAGING — CT CT CHEST W/ CM
2 of 4 series · 15 of 36 positions shown, 18 images · IV contrast (omnipaque)
Comparison: Chest radiographs, 11/26/2018

CLINICAL DATA: Shortness of breath, chest pressure

EXAM:
CT CHEST WITH CONTRAST
TECHNIQUE: Multidetector CT imaging of the chest was performed during
intravenous contrast administration.
CONTRAST:  75mL OMNIPAQUE IOHEXOL 300 MG/ML  SOLN

[Series 2: axial chest 2.00 · axial · 0.54mm/px · z∈[-1163,-899]mm · 12 of 156 slices shown, 15 images]
[im 12/156  mediastinal]
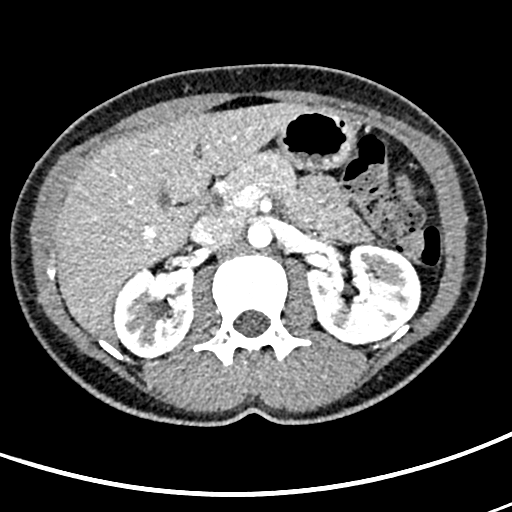
[im 12/156  lung]
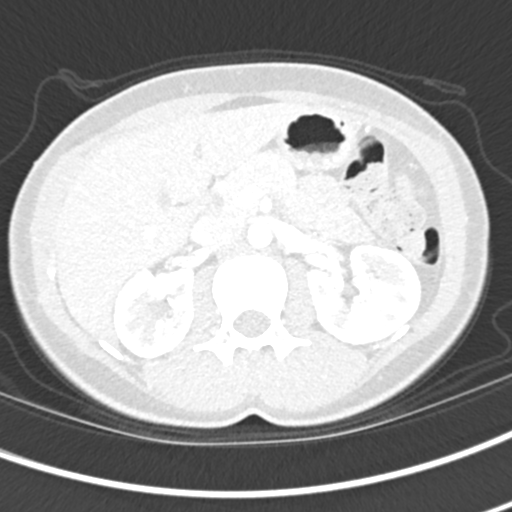
[im 24/156  lung]
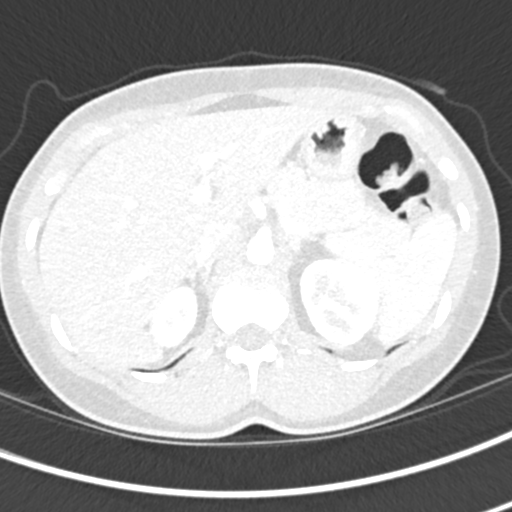
[im 36/156  lung]
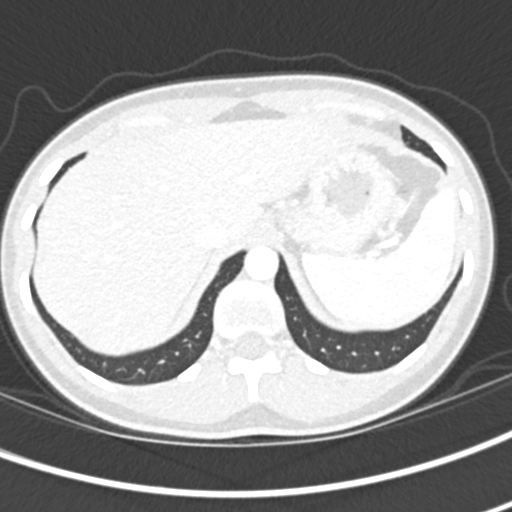
[im 48/156  lung]
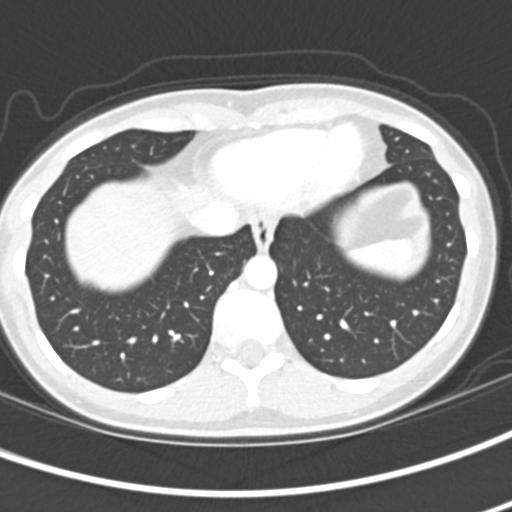
[im 60/156  mediastinal]
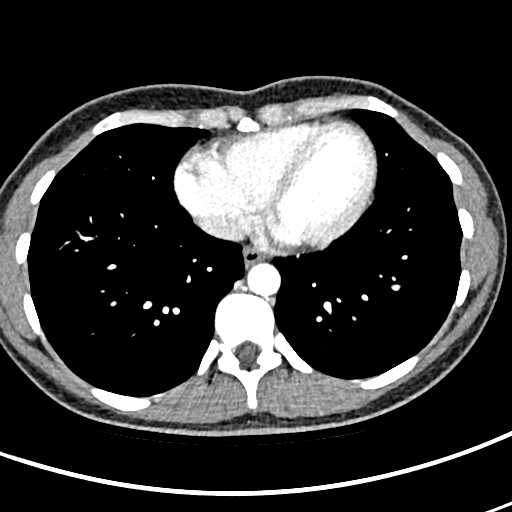
[im 60/156  lung]
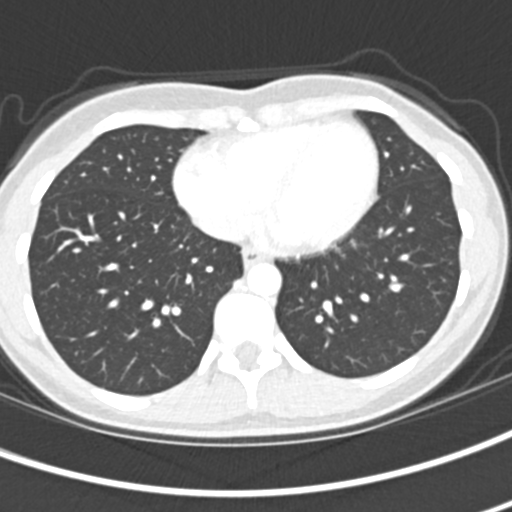
[im 72/156  lung]
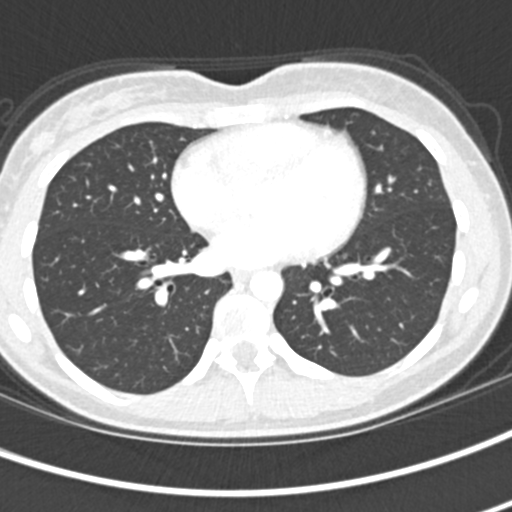
[im 84/156  lung]
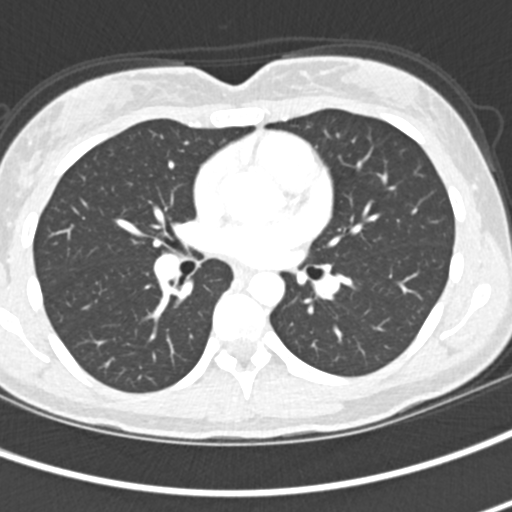
[im 96/156  lung]
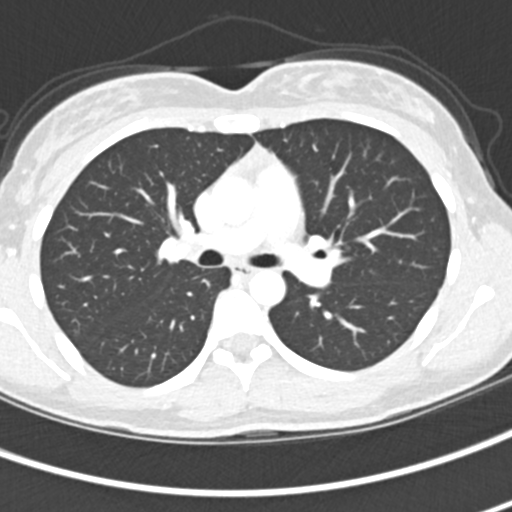
[im 108/156  mediastinal]
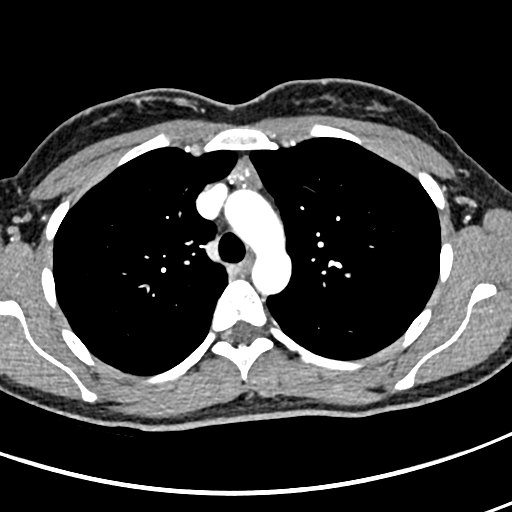
[im 108/156  lung]
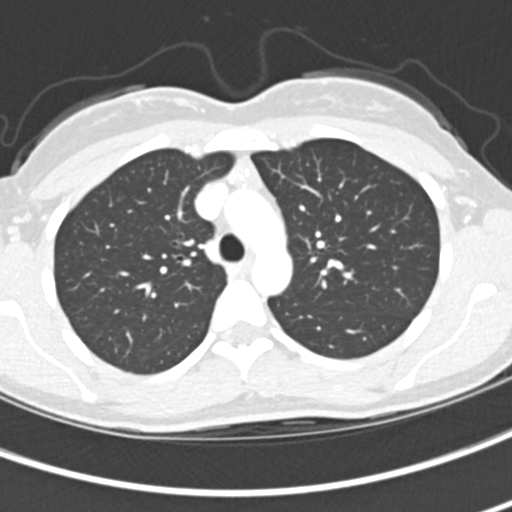
[im 120/156  lung]
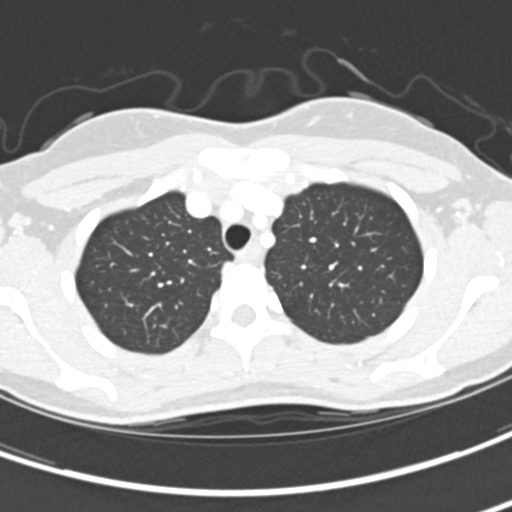
[im 132/156  lung]
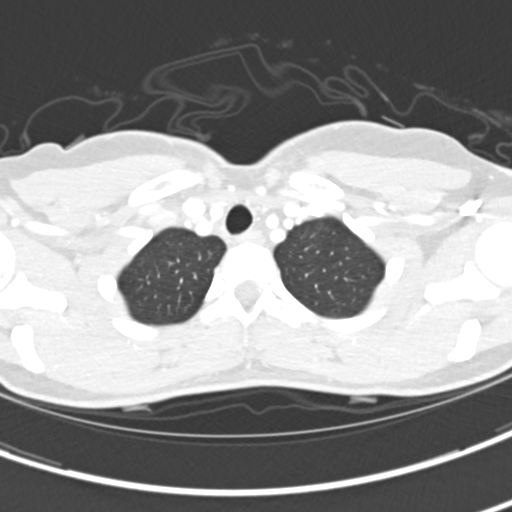
[im 144/156  lung]
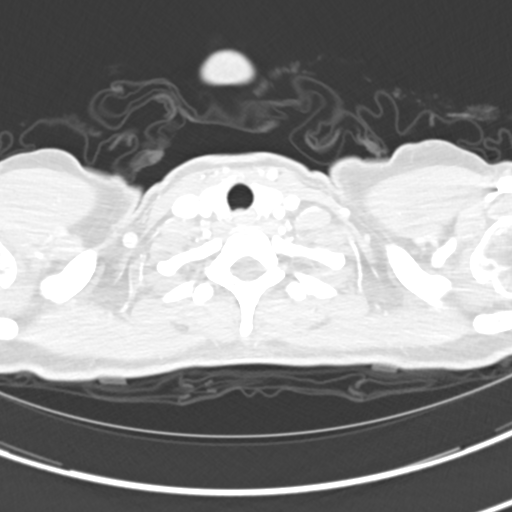

[Series 4: coronal chest 2.00 cor · coronal · 0.54mm/px · 3 of 134 slices shown]
[im 27/134  lung]
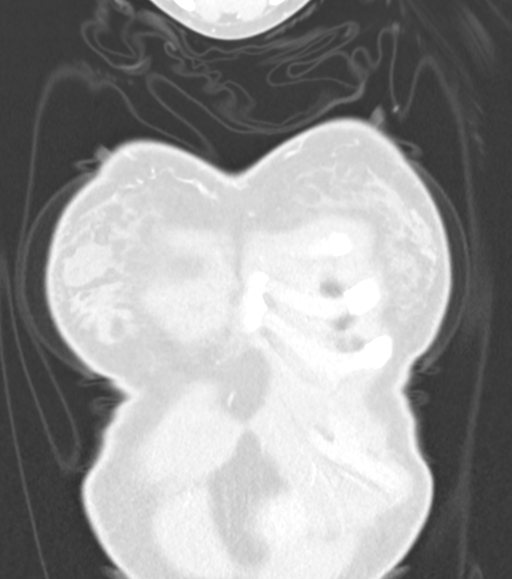
[im 54/134  lung]
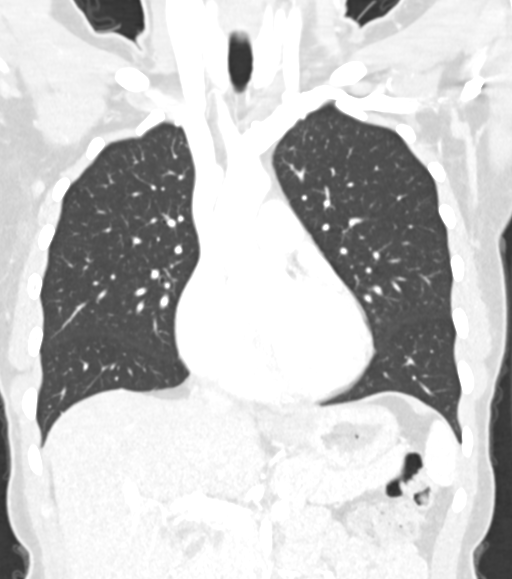
[im 80/134  lung]
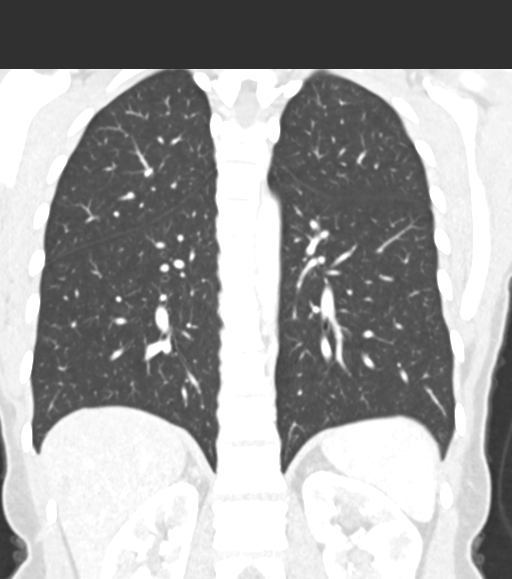

[15 of 36 positions shown; findings below may reference images not displayed]

FINDINGS: Cardiovascular: No significant vascular findings. Normal heart size.
No pericardial effusion.

Mediastinum/Nodes: No enlarged mediastinal, hilar, or axillary lymph
nodes. Age-appropriate thymic remnant in the anterior mediastinum.
Thyroid gland, trachea, and esophagus demonstrate no significant
findings.

Lungs/Pleura: There is a nonspecific 3 mm pulmonary nodule of the
inferior right upper lobe adjacent to the minor fissure (series 3,
image 64). No pleural effusion or pneumothorax.

Upper Abdomen: No acute abnormality.

Musculoskeletal: No chest wall mass or suspicious bone lesions
identified.
IMPRESSION: 1.  No CT findings to explain shortness of breath or chest pressure.

2. There is a nonspecific 3 mm pulmonary nodule of the inferior
right upper lobe adjacent to the minor fissure (series 3, image 64).
No follow-up needed if patient is low-risk. Non-contrast chest CT
can be considered in 12 months if patient is high-risk. This
recommendation follows the consensus statement: Guidelines for
Management of Incidental Pulmonary Nodules Detected on CT Images:

## 2021-02-17 ENCOUNTER — Ambulatory Visit (INDEPENDENT_AMBULATORY_CARE_PROVIDER_SITE_OTHER): Payer: No Typology Code available for payment source | Admitting: Certified Nurse Midwife

## 2021-02-17 ENCOUNTER — Other Ambulatory Visit: Payer: Self-pay

## 2021-02-17 ENCOUNTER — Encounter: Payer: Self-pay | Admitting: Certified Nurse Midwife

## 2021-02-17 VITALS — BP 141/84 | HR 76 | Ht 61.0 in | Wt 116.6 lb

## 2021-02-17 DIAGNOSIS — R5383 Other fatigue: Secondary | ICD-10-CM

## 2021-02-17 DIAGNOSIS — Z113 Encounter for screening for infections with a predominantly sexual mode of transmission: Secondary | ICD-10-CM | POA: Diagnosis not present

## 2021-02-17 DIAGNOSIS — Z01419 Encounter for gynecological examination (general) (routine) without abnormal findings: Secondary | ICD-10-CM

## 2021-02-17 NOTE — Progress Notes (Signed)
GYNECOLOGY ANNUAL PREVENTATIVE CARE ENCOUNTER NOTE  History:     Shelby Hartman is a 30 y.o. G18P2002 female here for a routine annual gynecologic exam.  Current complaints: fatigue, anxiety that increase with her menstrual cycle.   Denies abnormal vaginal bleeding, discharge, pelvic pain, problems with intercourse or other gynecologic concerns.     Social Relationship: female partner  Living: 2 children  Smoke/Alcohol/drug use: denies use   Gynecologic History Patient's last menstrual period was 02/16/2021 (exact date). Contraception: condoms Last Pap: 01/13/2019. Results were: normal with negative HPV Last mammogram: n/a.    Upstream - 02/17/21 1057       Pregnancy Intention Screening   Does the patient want to become pregnant in the next year? No    Does the patient's partner want to become pregnant in the next year? No            The pregnancy intention screening data noted above was reviewed. Potential methods of contraception were discussed. The patient elected to proceed with condoms.   Obstetric History OB History  Gravida Para Term Preterm AB Living  2 2 2     2   SAB IAB Ectopic Multiple Live Births          2    # Outcome Date GA Lbr Len/2nd Weight Sex Delivery Anes PTL Lv  2 Term 05/03/09   6 lb 7 oz (2.92 kg) M CS-Unspec  N LIV  1 Term 08/23/05   5 lb 13 oz (2.637 kg) F CS-Unspec  N LIV    History reviewed. No pertinent past medical history.  Past Surgical History:  Procedure Laterality Date   CESAREAN SECTION     SKIN BIOPSY      Current Outpatient Medications on File Prior to Visit  Medication Sig Dispense Refill   Multiple Vitamins-Minerals (MEGA MULTI WOMEN PO) Take by mouth.     amoxicillin-clavulanate (AUGMENTIN) 875-125 MG tablet Take 1 tablet by mouth 2 (two) times daily. (Patient not taking: Reported on 02/17/2021)     pantoprazole (PROTONIX) 40 MG tablet Take 40 mg by mouth daily. (Patient not taking: Reported on 02/17/2021)      [DISCONTINUED] drospirenone-ethinyl estradiol (YAZ) 3-0.02 MG tablet Take 1 tablet by mouth daily. 1 Package 11   [DISCONTINUED] famotidine (PEPCID) 20 MG tablet Take 1 tablet (20 mg total) by mouth 2 (two) times daily. 60 tablet 1   No current facility-administered medications on file prior to visit.    No Known Allergies  Social History:  reports that she has never smoked. She has never used smokeless tobacco. She reports that she does not drink alcohol and does not use drugs.  Family History  Problem Relation Age of Onset   Breast cancer Maternal Aunt    Breast cancer Paternal Aunt     The following portions of the patient's history were reviewed and updated as appropriate: allergies, current medications, past family history, past medical history, past social history, past surgical history and problem list.  Review of Systems Pertinent items noted in HPI and remainder of comprehensive ROS otherwise negative.  Physical Exam:  BP (!) 141/84   Pulse 76   Ht 5\' 1"  (1.549 m)   Wt 116 lb 9.6 oz (52.9 kg)   LMP 02/16/2021 (Exact Date)   BMI 22.03 kg/m  CONSTITUTIONAL: Well-developed, well-nourished female in no acute distress.  HENT:  Normocephalic, atraumatic, External right and left ear normal. Oropharynx is clear and moist EYES: Conjunctivae and EOM are  normal. Pupils are equal, round, and reactive to light. No scleral icterus.  NECK: Normal range of motion, supple, no masses.  Normal thyroid.  SKIN: Skin is warm and dry. No rash noted. Not diaphoretic. No erythema. No pallor. MUSCULOSKELETAL: Normal range of motion. No tenderness.  No cyanosis, clubbing, or edema.  2+ distal pulses. NEUROLOGIC: Alert and oriented to person, place, and time. Normal reflexes, muscle tone coordination.  PSYCHIATRIC: Normal mood and affect. Normal behavior. Normal judgment and thought content. CARDIOVASCULAR: Normal heart rate noted, regular rhythm RESPIRATORY: Clear to auscultation bilaterally.  Effort and breath sounds normal, no problems with respiration noted. BREASTS: Symmetric in size. No masses, tenderness, skin changes, nipple drainage, or lymphadenopathy bilaterally.  ABDOMEN: Soft, no distention noted.  No tenderness, rebound or guarding.  PELVIC: deferred pt on her cycle ,  pap not due .   Assessment and Plan:    1. Other fatigue  - CBC - Vitamin D (25 hydroxy) - Vitamin B6 - Vitamin B12  2. Screening examination for STD (sexually transmitted disease)  - HIV Antibody (routine testing w rflx) - Hepatitis C Antibody  Pap: not due Mammogram : n/a  Labs: cbc, vitamin D, B 6,b12 Refills: none Referral: none PT declines use of medication for anxiety, discuss PMMD and treatment options. State she would prefer to manage naturally and will seek counseling through her work.  Routine preventative health maintenance measures emphasized. Please refer to After Visit Summary for other counseling recommendations.      Doreene Burke, CNM [facility],  [facility]

## 2021-02-17 NOTE — Patient Instructions (Signed)
Insomnia Insomnia is a sleep disorder that makes it difficult to fall asleep or stay asleep. Insomnia can cause fatigue, low energy, difficulty concentrating, mood swings, and poor performance at work or school. There are three different ways to classify insomnia: Difficulty falling asleep. Difficulty staying asleep. Waking up too early in the morning. Any type of insomnia can be long-term (chronic) or short-term (acute). Both are common. Short-term insomnia usually lasts for three months or less. Chronic insomnia occurs at least three times a week for longer than three months. What are the causes? Insomnia may be caused by another condition, situation, or substance, such as: Anxiety. Certain medicines. Gastroesophageal reflux disease (GERD) or other gastrointestinal conditions. Asthma or other breathing conditions. Restless legs syndrome, sleep apnea, or other sleep disorders. Chronic pain. Menopause. Stroke. Abuse of alcohol, tobacco, or illegal drugs. Mental health conditions, such as depression. Caffeine. Neurological disorders, such as Alzheimer's disease. An overactive thyroid (hyperthyroidism). Sometimes, the cause of insomnia may not be known. What increases the risk? Risk factors for insomnia include: Gender. Women are affected more often than men. Age. Insomnia is more common as you get older. Stress. Lack of exercise. Irregular work schedule or working night shifts. Traveling between different time zones. Certain medical and mental health conditions. What are the signs or symptoms? If you have insomnia, the main symptom is having trouble falling asleep or having trouble staying asleep. This may lead to other symptoms, such as: Feeling fatigued or having low energy. Feeling nervous about going to sleep. Not feeling rested in the morning. Having trouble concentrating. Feeling irritable, anxious, or depressed. How is this diagnosed? This condition may be diagnosed  based on: Your symptoms and medical history. Your health care provider may ask about: Your sleep habits. Any medical conditions you have. Your mental health. A physical exam. How is this treated? Treatment for insomnia depends on the cause. Treatment may focus on treating an underlying condition that is causing insomnia. Treatment may also include: Medicines to help you sleep. Counseling or therapy. Lifestyle adjustments to help you sleep better. Follow these instructions at home: Eating and drinking  Limit or avoid alcohol, caffeinated beverages, and cigarettes, especially close to bedtime. These can disrupt your sleep. Do not eat a large meal or eat spicy foods right before bedtime. This can lead to digestive discomfort that can make it hard for you to sleep. Sleep habits  Keep a sleep diary to help you and your health care provider figure out what could be causing your insomnia. Write down: When you sleep. When you wake up during the night. How well you sleep. How rested you feel the next day. Any side effects of medicines you are taking. What you eat and drink. Make your bedroom a dark, comfortable place where it is easy to fall asleep. Put up shades or blackout curtains to block light from outside. Use a white noise machine to block noise. Keep the temperature cool. Limit screen use before bedtime. This includes: Watching TV. Using your smartphone, tablet, or computer. Stick to a routine that includes going to bed and waking up at the same times every day and night. This can help you fall asleep faster. Consider making a quiet activity, such as reading, part of your nighttime routine. Try to avoid taking naps during the day so that you sleep better at night. Get out of bed if you are still awake after 15 minutes of trying to sleep. Keep the lights down, but try reading or doing a  quiet activity. When you feel sleepy, go back to bed. General instructions Take over-the-counter  and prescription medicines only as told by your health care provider. Exercise regularly, as told by your health care provider. Avoid exercise starting several hours before bedtime. Use relaxation techniques to manage stress. Ask your health care provider to suggest some techniques that may work well for you. These may include: Breathing exercises. Routines to release muscle tension. Visualizing peaceful scenes. Make sure that you drive carefully. Avoid driving if you feel very sleepy. Keep all follow-up visits as told by your health care provider. This is important. Contact a health care provider if: You are tired throughout the day. You have trouble in your daily routine due to sleepiness. You continue to have sleep problems, or your sleep problems get worse. Get help right away if: You have serious thoughts about hurting yourself or someone else. If you ever feel like you may hurt yourself or others, or have thoughts about taking your own life, get help right away. You can go to your nearest emergency department or call: Your local emergency services (911 in the [place] A suicide crisis helpline, such as the National Suicide Prevention Lifeline at 204-228-3124. This is open 24 hours a day. Summary Insomnia is a sleep disorder that makes it difficult to fall asleep or stay asleep. Insomnia can be long-term (chronic) or short-term (acute). Treatment for insomnia depends on the cause. Treatment may focus on treating an underlying condition that is causing insomnia. Keep a sleep diary to help you and your health care provider figure out what could be causing your insomnia. This information is not intended to replace advice given to you by your health care provider. Make sure you discuss any questions you have with your health care provider. Document Revised: 03/19/2020 Document Reviewed: 03/19/2020 Elsevier Patient Education  2022 [employer] Generalized Anxiety Disorder,  Adult Generalized anxiety disorder (GAD) is a mental health condition. Unlike normal worries, anxiety related to GAD is not triggered by a specific event. These worries do not fade or get better with time. GAD interferes with relationships, work, and school. GAD symptoms can vary from mild to severe. People with severe GAD can have intense waves of anxiety with physical symptoms that are similar to panic attacks. What are the causes? The exact cause of GAD is not known, but the following are believed to have an impact: Differences in natural brain chemicals. Genes passed down from parents to children. Differences in the way threats are perceived. Development during childhood. Personality. What increases the risk? The following factors may make you more likely to develop this condition: Being female. Having a family history of anxiety disorders. Being very shy. Experiencing very stressful life events, such as the death of a loved one. Having a very stressful family environment. What are the signs or symptoms? People with GAD often worry excessively about many things in their lives, such as their health and family. Symptoms may also include: Mental and emotional symptoms: Worrying excessively about natural disasters. Fear of being late. Difficulty concentrating. Fears that others are judging your performance. Physical symptoms: Fatigue. Headaches, muscle tension, muscle twitches, trembling, or feeling shaky. Feeling like your heart is pounding or beating very fast. Feeling out of breath or like you cannot take a deep breath. Having trouble falling asleep or staying asleep, or experiencing restlessness. Sweating. Nausea, diarrhea, or irritable bowel syndrome (IBS). Behavioral symptoms: Experiencing erratic moods or irritability. Avoidance of new situations. Avoidance of people. Extreme difficulty making  decisions. How is this diagnosed? This condition is diagnosed based on your  symptoms and medical history. You will also have a physical exam. Your health care provider may perform tests to rule out other possible causes of your symptoms. To be diagnosed with GAD, a person must have anxiety that: Is out of his or her control. Affects several different aspects of his or her life, such as work and relationships. Causes distress that makes him or her unable to take part in normal activities. Includes at least three symptoms of GAD, such as restlessness, fatigue, trouble concentrating, irritability, muscle tension, or sleep problems. Before your health care provider can confirm a diagnosis of GAD, these symptoms must be present more days than they are not, and they must last for 6 months or longer. How is this treated? This condition may be treated with: Medicine. Antidepressant medicine is usually prescribed for long-term daily control. Anti-anxiety medicines may be added in severe cases, especially when panic attacks occur. Talk therapy (psychotherapy). Certain types of talk therapy can be helpful in treating GAD by providing support, education, and guidance. Options include: Cognitive behavioral therapy (CBT). People learn coping skills and self-calming techniques to ease their physical symptoms. They learn to identify unrealistic thoughts and behaviors and to replace them with more appropriate thoughts and behaviors. Acceptance and commitment therapy (ACT). This treatment teaches people how to be mindful as a way to cope with unwanted thoughts and feelings. Biofeedback. This process trains you to manage your body's response (physiological response) through breathing techniques and relaxation methods. You will work with a therapist while machines are used to monitor your physical symptoms. Stress management techniques. These include yoga, meditation, and exercise. A mental health specialist can help determine which treatment is best for you. Some people see improvement with one  type of therapy. However, other people require a combination of therapies. Follow these instructions at home: Lifestyle Maintain a consistent routine and schedule. Anticipate stressful situations. Create a plan, and allow extra time to work with your plan. Practice stress management or self-calming techniques that you have learned from your therapist or your health care provider. General instructions Take over-the-counter and prescription medicines only as told by your health care provider. Understand that you are likely to have setbacks. Accept this and be kind to yourself as you persist to take better care of yourself. Recognize and accept your accomplishments, even if you judge them as small. Keep all follow-up visits as told by your health care provider. This is important. Contact a health care provider if: Your symptoms do not get better. Your symptoms get worse. You have signs of depression, such as: A persistently sad or irritable mood. Loss of enjoyment in activities that used to bring you joy. Change in weight or eating. Changes in sleeping habits. Avoiding friends or family members. Loss of energy for normal tasks. Feelings of guilt or worthlessness. Get help right away if: You have serious thoughts about hurting yourself or others. If you ever feel like you may hurt yourself or others, or have thoughts about taking your own life, get help right away. Go to your nearest emergency department or: Call your local emergency services (911 in the [place] Call a suicide crisis helpline, such as the National Suicide Prevention Lifeline at (279)837-1341. This is open 24 hours a day in the [place] Text the Crisis Text Line at 478-120-7529 (in the [place] Summary Generalized anxiety disorder (GAD) is a mental health condition that involves worry that is not triggered  by a specific event. People with GAD often worry excessively about many things in their lives, such as their health and family. GAD  may cause symptoms such as restlessness, trouble concentrating, sleep problems, frequent sweating, nausea, diarrhea, headaches, and trembling or muscle twitching. A mental health specialist can help determine which treatment is best for you. Some people see improvement with one type of therapy. However, other people require a combination of therapies. This information is not intended to replace advice given to you by your health care provider. Make sure you discuss any questions you have with your health care provider. Document Revised: 02/27/2019 Document Reviewed: 02/27/2019 Elsevier Patient Education  2022 [employer].

## 2021-02-19 LAB — CBC
Hematocrit: 41.8 % (ref 34.0–46.6)
Hemoglobin: 13.9 g/dL (ref 11.1–15.9)
MCH: 29.9 pg (ref 26.6–33.0)
MCHC: 33.3 g/dL (ref 31.5–35.7)
MCV: 90 fL (ref 79–97)
Platelets: 364 10*3/uL (ref 150–450)
RBC: 4.65 x10E6/uL (ref 3.77–5.28)
RDW: 12.6 % (ref 11.7–15.4)
WBC: 5.4 10*3/uL (ref 3.4–10.8)

## 2021-02-19 LAB — HIV ANTIBODY (ROUTINE TESTING W REFLEX): HIV Screen 4th Generation wRfx: NONREACTIVE

## 2021-02-19 LAB — VITAMIN B12: Vitamin B-12: 596 pg/mL (ref 232–1245)

## 2021-02-19 LAB — VITAMIN B6: Vitamin B6: 8.7 ug/L (ref 3.4–65.2)

## 2021-02-19 LAB — HEPATITIS C ANTIBODY: Hep C Virus Ab: <0.1 s/co ratio (ref 0.0–0.9)

## 2021-02-19 LAB — VITAMIN D 25 HYDROXY (VIT D DEFICIENCY, FRACTURES): Vit D, 25-Hydroxy: 37 ng/mL (ref 30.0–100.0)

## 2021-09-30 ENCOUNTER — Encounter: Payer: Self-pay | Admitting: Certified Nurse Midwife

## 2021-09-30 ENCOUNTER — Ambulatory Visit (INDEPENDENT_AMBULATORY_CARE_PROVIDER_SITE_OTHER): Payer: No Typology Code available for payment source | Admitting: Certified Nurse Midwife

## 2021-09-30 VITALS — BP 117/77 | HR 69 | Ht 61.0 in | Wt 124.1 lb

## 2021-09-30 DIAGNOSIS — Z30019 Encounter for initial prescription of contraceptives, unspecified: Secondary | ICD-10-CM

## 2021-09-30 LAB — POCT URINE PREGNANCY: Preg Test, Ur: NEGATIVE

## 2021-09-30 MED ORDER — NORETHIN-ETH ESTRAD-FE BIPHAS 1 MG-10 MCG / 10 MCG PO TABS: 1.0000 | ORAL_TABLET | Freq: Every day | ORAL | 3 refills | Status: DC

## 2021-09-30 NOTE — Progress Notes (Signed)
Subjective:  ? ? Shelby Hartman is a 31 y.o. female who presents for contraception counseling. The patient has no complaints today. The patient is sexually active. Pertinent past medical history: none. ? ?Menstrual History: ?OB History   ? ? Gravida  ?2  ? Para  ?2  ? Term  ?2  ? Preterm  ?   ? AB  ?   ? Living  ?2  ?  ? ? SAB  ?   ? IAB  ?   ? Ectopic  ?   ? Multiple  ?   ? Live Births  ?2  ?   ?  ?  ?  ? ?No LMP recorded (lmp unknown). ?  ? ?The following portions of the patient's history were reviewed and updated as appropriate: allergies, current medications, past family history, past medical history, past social history, past surgical history, and problem list. ? ?Review of Systems ?Pertinent items are noted in HPI.  ? ?Objective:  ? ? No exam performed today,  not indicated for [facility] .  ? ?Assessment:  ? ? 31 y.o., starting OCP (estrogen/progesterone), no contraindications.  ? ?Plan:  ? ? All questions answered., She denies any contraindications to use. Discussed starting options. Orders placed follow up PRN.  ? ?Doreene Burke, CNM   ?

## 2022-02-22 ENCOUNTER — Encounter: Payer: Self-pay | Admitting: Certified Nurse Midwife

## 2023-02-12 ENCOUNTER — Encounter: Payer: Self-pay | Admitting: Emergency Medicine

## 2023-02-12 ENCOUNTER — Ambulatory Visit
Admission: EM | Admit: 2023-02-12 | Discharge: 2023-02-12 | Disposition: A | Discharge status: Home / Self Care | Payer: Medicaid Other | Attending: Emergency Medicine | Admitting: Emergency Medicine

## 2023-02-12 ENCOUNTER — Other Ambulatory Visit: Payer: Self-pay

## 2023-02-12 DIAGNOSIS — T63301A Toxic effect of unspecified spider venom, accidental (unintentional), initial encounter: Secondary | ICD-10-CM

## 2023-02-12 DIAGNOSIS — W57XXXA Bitten or stung by nonvenomous insect and other nonvenomous arthropods, initial encounter: Secondary | ICD-10-CM

## 2023-02-12 DIAGNOSIS — T7840XA Allergy, unspecified, initial encounter: Secondary | ICD-10-CM

## 2023-02-12 DIAGNOSIS — S20161A Insect bite (nonvenomous) of breast, right breast, initial encounter: Secondary | ICD-10-CM

## 2023-02-12 MED ORDER — DOXYCYCLINE HYCLATE 100 MG PO CAPS: 100.0000 mg | ORAL_CAPSULE | Freq: Two times a day (BID) | ORAL | 0 refills | Status: AC

## 2023-02-12 MED ORDER — PREDNISONE 10 MG (21) PO TBPK
ORAL_TABLET | ORAL | 0 refills | Status: AC
Start: 2023-02-12 — End: ?

## 2023-02-12 NOTE — Discharge Instructions (Addendum)
Take over-the-counter Allegra 180 mg daily or Zyrtec or Claritin 10 mg daily to help with your itching.  You can take over-the-counter Benadryl, 50 mg at bedtime, as needed for itching and sleep.  Take the prednisone pack according to the package instructions.  You will taken on tapering dose over a period of 6 days.  Take it with food and always take it first in the morning with breakfast.  Take over-the-counter Pepcid 20 mg twice daily to help with itching as well.  If you develop any swelling of your lips or tongue, tightness in your throat, or difficulty breathing you need to go to the ER for evaluation.   Because you were written by a tick 2 weeks ago we will also cover you for potential tickborne illness with doxycycline 100 mg twice daily with food for 10 days.  Be mindful the doxycycline will make you more prone to sunburn so make sure that you are wearing sunscreen if you are outdoors.  Also make sure that you reapply it if free 90 minutes to prevent sunburn.

## 2023-02-12 NOTE — ED Triage Notes (Addendum)
Pt reports tick bite 2 weeks ago and spider bite one week ago.  Pt reports generalized rash started yesterday. Used benadryl OTC and has had some improvement.  Spider bite to left arm and tick was on right nipple.  Subjective fever last night. Pt reports pain to abdomen last night but this has resolved.

## 2023-02-12 NOTE — ED Provider Notes (Addendum)
[facility]    CSN: [id number] Arrival date & time: 02/12/23  1241      History   Chief Complaint Chief Complaint  Patient presents with   Insect Bite    HPI Shelby Hartman is a 32 y.o. female.   HPI  32 year old female with no significant past medical history presents for evaluation of 3 separate complaints.  The first complaint is that she was bit by a tick on her right nipple 2 weeks ago.  The tick is known to present and she reports that there is a small scabbed area but no redness or drainage.  Approximately a week ago she was bit on the inner aspect of her left elbow by a spider that initially was red and swollen but now is just bruised.  Last night she developed a generalized body rash that started after eating pizza.  She reports that she has eaten this pizza before and has never had any issues.  She denies any new personal hygiene products, cosmetics, supplements, or clothing.  She did take Benadryl which helped her symptoms.  She describes the rash as being itchy.  Today the rash is confined to her abdomen.  History reviewed. No pertinent past medical history.  There are no problems to display for this patient.   Past Surgical History:  Procedure Laterality Date   CESAREAN SECTION     SKIN BIOPSY      OB History     Gravida  2   Para  2   Term  2   Preterm      AB      Living  2      SAB      IAB      Ectopic      Multiple      Live Births  2            Home Medications    Prior to Admission medications   Medication Sig Start Date End Date Taking? Authorizing Provider  doxycycline (VIBRAMYCIN) 100 MG capsule Take 1 capsule (100 mg total) by mouth 2 (two) times daily. 02/12/23  Yes Becky Augusta, NP  predniSONE (STERAPRED UNI-PAK 21 TAB) 10 MG (21) TBPK tablet Take 6 tablets on day 1, 5 tablets day 2, 4 tablets day 3, 3 tablets day 4, 2 tablets day 5, 1 tablet day 6 02/12/23  Yes Becky Augusta, NP  Multiple  Vitamins-Minerals (MEGA MULTI WOMEN PO) Take by mouth.    [provider]  pantoprazole (PROTONIX) 40 MG tablet Take 40 mg by mouth daily. Patient not taking: Reported on 02/17/2021    [provider]  drospirenone-ethinyl estradiol (YAZ) 3-0.02 MG tablet Take 1 tablet by mouth daily. 01/18/19 04/20/19  Doreene Burke, CNM  famotidine (PEPCID) 20 MG tablet Take 1 tablet (20 mg total) by mouth 2 (two) times daily. 11/26/18 05/24/19  Arnaldo Natal, MD    Family History Family History  Problem Relation Age of Onset   Breast cancer Maternal Aunt    Breast cancer Paternal Aunt     Social History Social History   Tobacco Use   Smoking status: Never   Smokeless tobacco: Never  Vaping Use   Vaping status: Some Days   Substances: Nicotine  Substance Use Topics   Alcohol use: No   Drug use: Never     Allergies   Patient has no known allergies.   Review of Systems Review of Systems  Constitutional:  Negative for fever.  HENT:  Negative for trouble swallowing.   Respiratory:  Negative for shortness of breath, wheezing and stridor.   Skin:  Positive for color change, rash and wound.     Physical Exam Triage Vital Signs ED Triage Vitals  Encounter Vitals Group     BP      Systolic BP Percentile      Diastolic BP Percentile      Pulse      Resp      Temp      Temp src      SpO2      Weight      Height      Head Circumference      Peak Flow      Pain Score      Pain Loc      Pain Education      Exclude from Growth Chart    No data found.  Updated Vital Signs BP 120/83   Pulse 76   Temp 98.6 F (37 C) (Oral)   Resp 14   Ht 5\' 1"  (1.549 m)   Wt 120 lb (54.4 kg)   LMP 02/07/2023   SpO2 99%   BMI 22.67 kg/m   Visual Acuity Right Eye Distance:   Left Eye Distance:   Bilateral Distance:    Right Eye Near:   Left Eye Near:    Bilateral Near:     Physical Exam Vitals and nursing note reviewed.  Constitutional:      Appearance: Normal  appearance. She is not ill-appearing.  HENT:     Head: Normocephalic and atraumatic.  Cardiovascular:     Rate and Rhythm: Normal rate and regular rhythm.     Pulses: Normal pulses.     Heart sounds: Normal heart sounds. No murmur heard.    No friction rub. No gallop.  Pulmonary:     Effort: Pulmonary effort is normal.     Breath sounds: Normal breath sounds. No wheezing, rhonchi or rales.  Skin:    General: Skin is warm and dry.     Capillary Refill: Capillary refill takes less than 2 seconds.     Findings: Bruising, erythema and rash present.  Neurological:     General: No focal deficit present.     Mental Status: She is alert and oriented to person, place, and time.      UC Treatments / Results  Labs (all labs ordered are listed, but only abnormal results are displayed) Labs Reviewed - No data to display  EKG   Radiology No results found.  Procedures Procedures (including critical care time)  Medications Ordered in UC Medications - No data to display  Initial Impression / Assessment and Plan / UC Course  I have reviewed the triage vital signs and the nursing notes.  Pertinent labs & imaging results that were available during my care of the patient were reviewed by me and considered in my medical decision making (see chart for details).   Patient is a pleasant, nontoxic-appearing 32 year old female presenting for evaluation of tick bite, spider bite, and generalized body rash as outlined HPI above.  As you can see in image above, there is ecchymotic area on the medial aspect of the patient's left elbow where she was bitten by a spider.  The area is not hot to touch, tender, fluctuant, or indurated.  As you can see in image above the patient has an erythematous raised rash that is diffuse across the abdomen.  The rash last  night began after the patient ate pizza, though she has eaten this pizza before without issue.  The rash is consistent with hives.  The patient has  images on her phone from last night which show the rash being more diffuse on both upper and lower extremities as well as her low back.  The patient had a lesion on her left arm that was much more wheal-like in appearance.  I do not feel like the rash is being caused by either the tick or spider bite, though given her tick exposure I will cover her empirically with doxycycline 100 mg twice daily for 10 days.  Her symptoms were consistent with an allergic reaction I will treat her concurrently with prednisone and dual antihistamine therapy.  I have advised her that if she develops any swelling of her lips or tongue, tightness in her throat, or shortness of breath she should call 911 and go to the ER.  Also, if the rash returns following her treatment for allergic reaction that she should talk with her PCP about a referral to an allergist for testing.   Final Clinical Impressions(s) / UC Diagnoses   Final diagnoses:  Allergic reaction, initial encounter  Tick bite of right breast, initial encounter  Spider bite wound, accidental or unintentional, initial encounter     Discharge Instructions      Take over-the-counter Allegra 180 mg daily or Zyrtec or Claritin 10 mg daily to help with your itching.  You can take over-the-counter Benadryl, 50 mg at bedtime, as needed for itching and sleep.  Take the prednisone pack according to the package instructions.  You will taken on tapering dose over a period of 6 days.  Take it with food and always take it first in the morning with breakfast.  Take over-the-counter Pepcid 20 mg twice daily to help with itching as well.  If you develop any swelling of your lips or tongue, tightness in your throat, or difficulty breathing you need to go to the ER for evaluation.   Because you were written by a tick 2 weeks ago we will also cover you for potential tickborne illness with doxycycline 100 mg twice daily with food for 10 days.  Be mindful the doxycycline will  make you more prone to sunburn so make sure that you are wearing sunscreen if you are outdoors.  Also make sure that you reapply it if free 90 minutes to prevent sunburn.     ED Prescriptions     Medication Sig Dispense Auth. Provider   doxycycline (VIBRAMYCIN) 100 MG capsule Take 1 capsule (100 mg total) by mouth 2 (two) times daily. 20 capsule Becky Augusta, NP   predniSONE (STERAPRED UNI-PAK 21 TAB) 10 MG (21) TBPK tablet Take 6 tablets on day 1, 5 tablets day 2, 4 tablets day 3, 3 tablets day 4, 2 tablets day 5, 1 tablet day 6 21 tablet Becky Augusta, NP      PDMP not reviewed this encounter.   Becky Augusta, NP 02/12/23 1314    Becky Augusta, NP 02/12/23 [id number]
